# Patient Record
Sex: Male | Born: 1945
Health system: Southern US, Community
[De-identification: ages and names within clinical notes are randomized; demographics above are authoritative.]

## PROBLEM LIST (undated history)

## (undated) ENCOUNTER — Emergency Department (HOSPITAL_COMMUNITY): Payer: Medicare HMO

## (undated) DIAGNOSIS — I2510 Atherosclerotic heart disease of native coronary artery without angina pectoris: Secondary | ICD-10-CM

## (undated) DIAGNOSIS — I10 Essential (primary) hypertension: Secondary | ICD-10-CM

## (undated) DIAGNOSIS — E785 Hyperlipidemia, unspecified: Secondary | ICD-10-CM

## (undated) DIAGNOSIS — R220 Localized swelling, mass and lump, head: Secondary | ICD-10-CM

## (undated) DIAGNOSIS — N5201 Erectile dysfunction due to arterial insufficiency: Secondary | ICD-10-CM

## (undated) HISTORY — DX: Essential (primary) hypertension: I10

## (undated) HISTORY — DX: Atherosclerotic heart disease of native coronary artery without angina pectoris: I25.10

## (undated) HISTORY — DX: Erectile dysfunction due to arterial insufficiency: N52.01

## (undated) HISTORY — DX: Localized swelling, mass and lump, head: R22.0

## (undated) HISTORY — DX: Hyperlipidemia, unspecified: E78.5

## (undated) HISTORY — PX: SHOULDER SURGERY: SHX246

---

## 2006-09-28 ENCOUNTER — Encounter: Admission: RE | Admit: 2006-09-28 | Discharge: 2006-09-28 | Payer: Self-pay | Admitting: Orthopedic Surgery

## 2008-04-05 IMAGING — CR DG CHEST 2V
2 series · 2 of 2 positions shown · non-contrast
Comparison: None.

CLINICAL DATA: Shoulder pain.
 CHEST - TWO VIEWS:

[view not recorded (1 of 2)]
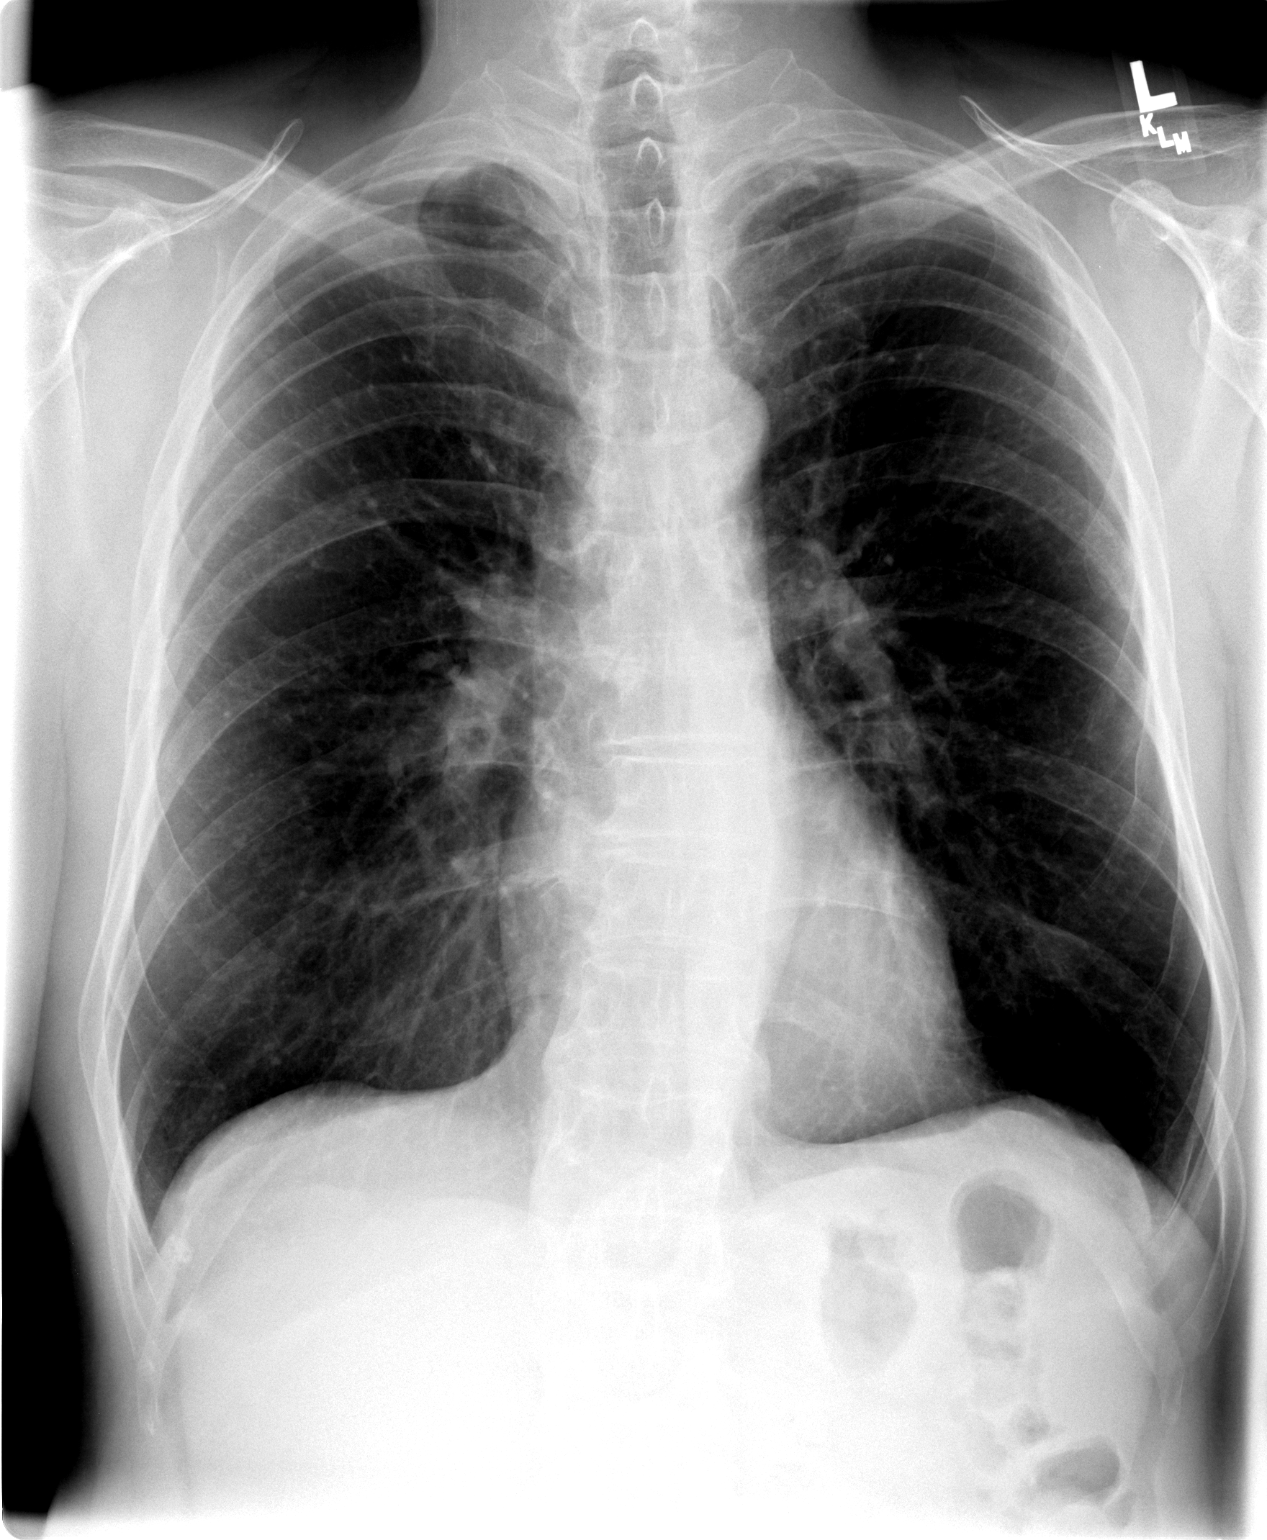

[view not recorded (2 of 2)]
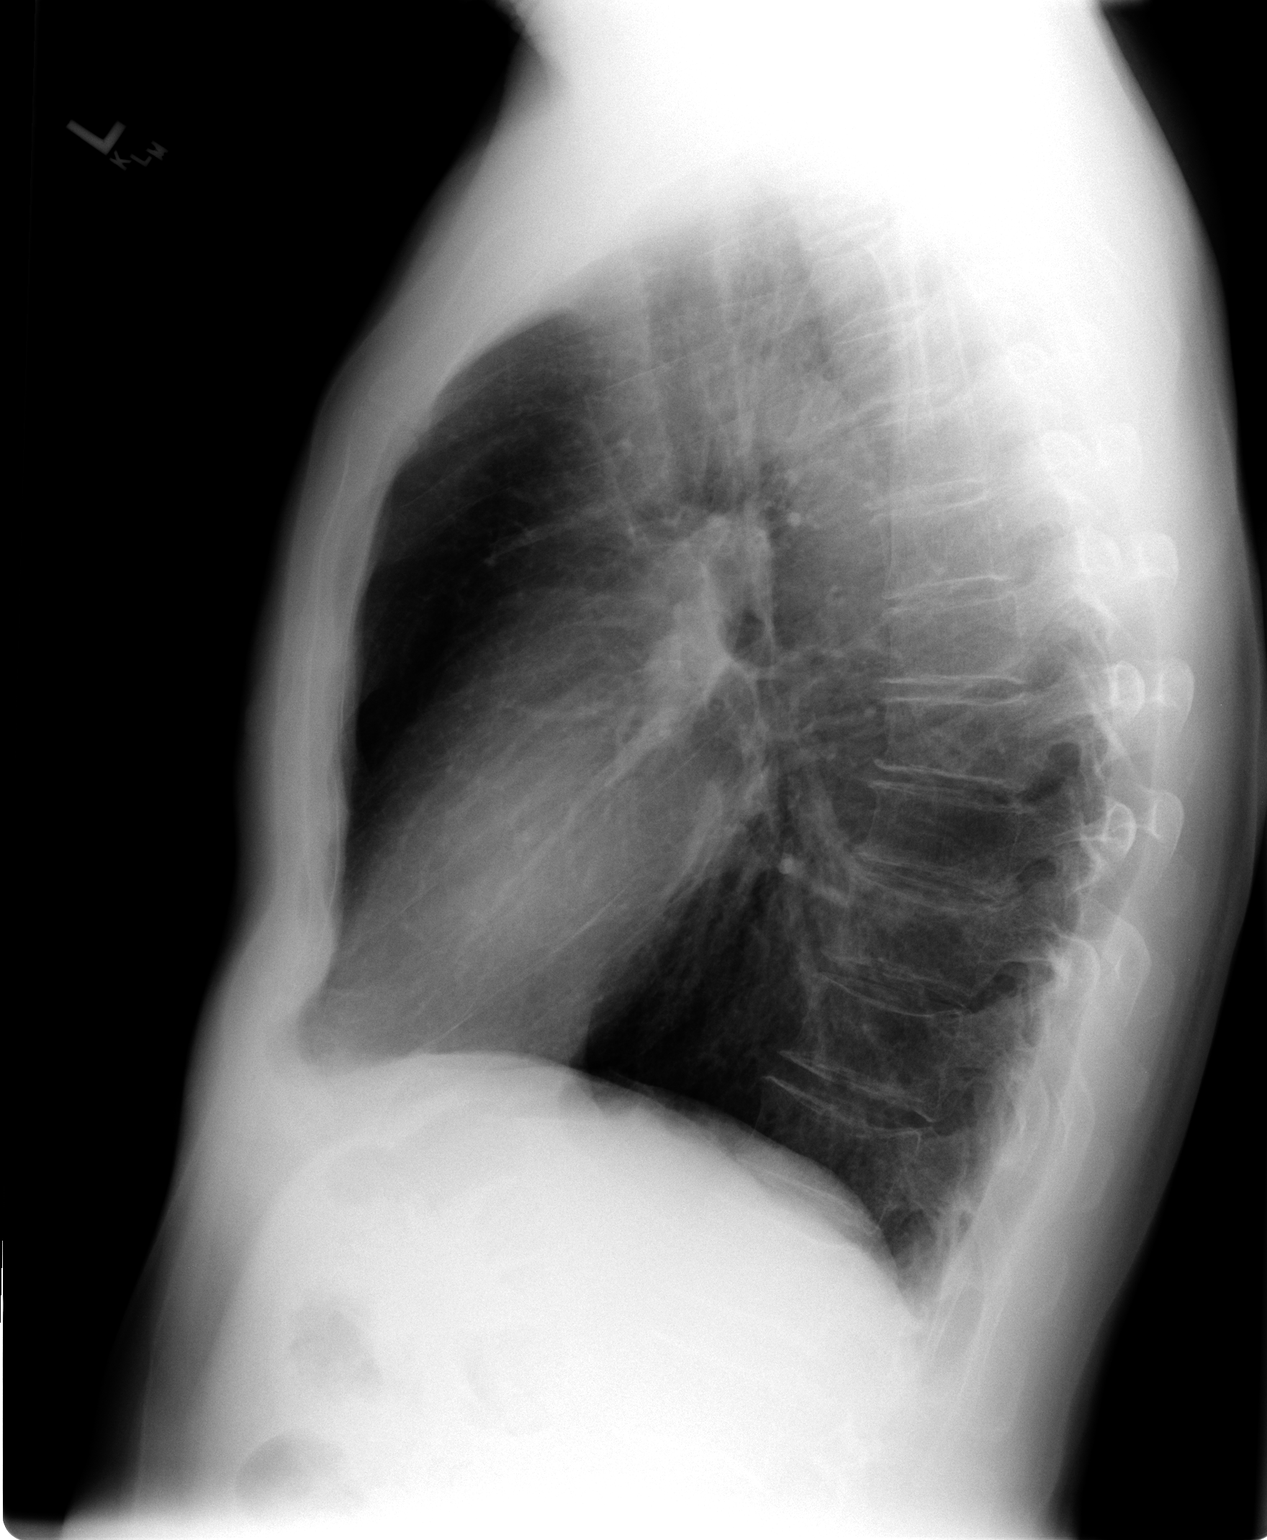

[2 of 2 positions shown; findings below may reference images not displayed]

There is mild hyperinflation.  The lungs are clear and the heart size is normal.  Mild apical scarring is noted bilaterally.
IMPRESSION: COPD.  No acute abnormality.

## 2015-05-22 DIAGNOSIS — E785 Hyperlipidemia, unspecified: Secondary | ICD-10-CM

## 2015-05-22 DIAGNOSIS — N5201 Erectile dysfunction due to arterial insufficiency: Secondary | ICD-10-CM

## 2015-05-22 DIAGNOSIS — I2510 Atherosclerotic heart disease of native coronary artery without angina pectoris: Secondary | ICD-10-CM

## 2015-05-22 DIAGNOSIS — I10 Essential (primary) hypertension: Secondary | ICD-10-CM | POA: Insufficient documentation

## 2015-05-22 HISTORY — DX: Atherosclerotic heart disease of native coronary artery without angina pectoris: I25.10

## 2015-05-22 HISTORY — DX: Essential (primary) hypertension: I10

## 2015-05-22 HISTORY — DX: Hyperlipidemia, unspecified: E78.5

## 2015-05-22 HISTORY — DX: Erectile dysfunction due to arterial insufficiency: N52.01

## 2015-07-04 ENCOUNTER — Encounter (INDEPENDENT_AMBULATORY_CARE_PROVIDER_SITE_OTHER): Payer: Medicare Other | Admitting: Ophthalmology

## 2015-07-04 DIAGNOSIS — H353112 Nonexudative age-related macular degeneration, right eye, intermediate dry stage: Secondary | ICD-10-CM | POA: Diagnosis not present

## 2015-07-04 DIAGNOSIS — H35033 Hypertensive retinopathy, bilateral: Secondary | ICD-10-CM | POA: Diagnosis not present

## 2015-07-04 DIAGNOSIS — H353124 Nonexudative age-related macular degeneration, left eye, advanced atrophic with subfoveal involvement: Secondary | ICD-10-CM

## 2015-07-04 DIAGNOSIS — I10 Essential (primary) hypertension: Secondary | ICD-10-CM

## 2015-07-04 DIAGNOSIS — H43813 Vitreous degeneration, bilateral: Secondary | ICD-10-CM | POA: Diagnosis not present

## 2015-08-06 DIAGNOSIS — H25012 Cortical age-related cataract, left eye: Secondary | ICD-10-CM | POA: Diagnosis not present

## 2015-08-06 DIAGNOSIS — H353124 Nonexudative age-related macular degeneration, left eye, advanced atrophic with subfoveal involvement: Secondary | ICD-10-CM | POA: Diagnosis not present

## 2015-08-06 DIAGNOSIS — H25011 Cortical age-related cataract, right eye: Secondary | ICD-10-CM | POA: Diagnosis not present

## 2015-08-06 DIAGNOSIS — H35031 Hypertensive retinopathy, right eye: Secondary | ICD-10-CM | POA: Diagnosis not present

## 2015-08-06 DIAGNOSIS — H35032 Hypertensive retinopathy, left eye: Secondary | ICD-10-CM | POA: Diagnosis not present

## 2015-08-11 DIAGNOSIS — Z13220 Encounter for screening for lipoid disorders: Secondary | ICD-10-CM | POA: Diagnosis not present

## 2015-08-11 DIAGNOSIS — Z125 Encounter for screening for malignant neoplasm of prostate: Secondary | ICD-10-CM | POA: Diagnosis not present

## 2015-08-11 DIAGNOSIS — M62522 Muscle wasting and atrophy, not elsewhere classified, left upper arm: Secondary | ICD-10-CM | POA: Diagnosis not present

## 2015-08-11 DIAGNOSIS — Z1389 Encounter for screening for other disorder: Secondary | ICD-10-CM | POA: Diagnosis not present

## 2015-08-11 DIAGNOSIS — Z139 Encounter for screening, unspecified: Secondary | ICD-10-CM | POA: Diagnosis not present

## 2015-08-11 DIAGNOSIS — Z79899 Other long term (current) drug therapy: Secondary | ICD-10-CM | POA: Diagnosis not present

## 2015-08-11 DIAGNOSIS — I10 Essential (primary) hypertension: Secondary | ICD-10-CM | POA: Diagnosis not present

## 2015-08-19 DIAGNOSIS — H2511 Age-related nuclear cataract, right eye: Secondary | ICD-10-CM | POA: Diagnosis not present

## 2015-08-19 DIAGNOSIS — H2512 Age-related nuclear cataract, left eye: Secondary | ICD-10-CM | POA: Diagnosis not present

## 2015-09-08 DIAGNOSIS — H25011 Cortical age-related cataract, right eye: Secondary | ICD-10-CM | POA: Diagnosis not present

## 2015-09-08 DIAGNOSIS — H2511 Age-related nuclear cataract, right eye: Secondary | ICD-10-CM | POA: Diagnosis not present

## 2015-09-16 DIAGNOSIS — H2511 Age-related nuclear cataract, right eye: Secondary | ICD-10-CM | POA: Diagnosis not present

## 2015-09-19 DIAGNOSIS — M4722 Other spondylosis with radiculopathy, cervical region: Secondary | ICD-10-CM | POA: Diagnosis not present

## 2015-09-19 DIAGNOSIS — M25512 Pain in left shoulder: Secondary | ICD-10-CM | POA: Diagnosis not present

## 2015-09-19 DIAGNOSIS — M4802 Spinal stenosis, cervical region: Secondary | ICD-10-CM | POA: Diagnosis not present

## 2015-09-19 DIAGNOSIS — M5412 Radiculopathy, cervical region: Secondary | ICD-10-CM | POA: Diagnosis not present

## 2016-01-02 ENCOUNTER — Ambulatory Visit (INDEPENDENT_AMBULATORY_CARE_PROVIDER_SITE_OTHER): Payer: Medicare Other | Admitting: Ophthalmology

## 2016-03-11 DIAGNOSIS — E785 Hyperlipidemia, unspecified: Secondary | ICD-10-CM | POA: Diagnosis not present

## 2016-03-11 DIAGNOSIS — I2510 Atherosclerotic heart disease of native coronary artery without angina pectoris: Secondary | ICD-10-CM | POA: Diagnosis not present

## 2016-03-11 DIAGNOSIS — I10 Essential (primary) hypertension: Secondary | ICD-10-CM | POA: Diagnosis not present

## 2016-03-29 DIAGNOSIS — E785 Hyperlipidemia, unspecified: Secondary | ICD-10-CM | POA: Diagnosis not present

## 2016-03-29 DIAGNOSIS — I2510 Atherosclerotic heart disease of native coronary artery without angina pectoris: Secondary | ICD-10-CM | POA: Diagnosis not present

## 2016-03-29 DIAGNOSIS — I10 Essential (primary) hypertension: Secondary | ICD-10-CM | POA: Diagnosis not present

## 2016-10-11 DIAGNOSIS — I10 Essential (primary) hypertension: Secondary | ICD-10-CM | POA: Diagnosis not present

## 2016-10-11 DIAGNOSIS — E785 Hyperlipidemia, unspecified: Secondary | ICD-10-CM | POA: Diagnosis not present

## 2016-10-11 DIAGNOSIS — I2510 Atherosclerotic heart disease of native coronary artery without angina pectoris: Secondary | ICD-10-CM | POA: Diagnosis not present

## 2016-10-19 DIAGNOSIS — Z139 Encounter for screening, unspecified: Secondary | ICD-10-CM | POA: Diagnosis not present

## 2016-10-19 DIAGNOSIS — I10 Essential (primary) hypertension: Secondary | ICD-10-CM | POA: Diagnosis not present

## 2016-10-19 DIAGNOSIS — Z1389 Encounter for screening for other disorder: Secondary | ICD-10-CM | POA: Diagnosis not present

## 2016-10-19 DIAGNOSIS — Z0000 Encounter for general adult medical examination without abnormal findings: Secondary | ICD-10-CM | POA: Diagnosis not present

## 2016-11-02 DIAGNOSIS — I10 Essential (primary) hypertension: Secondary | ICD-10-CM | POA: Diagnosis not present

## 2016-11-02 DIAGNOSIS — E785 Hyperlipidemia, unspecified: Secondary | ICD-10-CM | POA: Diagnosis not present

## 2017-04-07 DIAGNOSIS — H353111 Nonexudative age-related macular degeneration, right eye, early dry stage: Secondary | ICD-10-CM | POA: Diagnosis not present

## 2017-08-18 DIAGNOSIS — D234 Other benign neoplasm of skin of scalp and neck: Secondary | ICD-10-CM | POA: Diagnosis not present

## 2017-08-30 DIAGNOSIS — R220 Localized swelling, mass and lump, head: Secondary | ICD-10-CM | POA: Insufficient documentation

## 2017-08-30 HISTORY — DX: Localized swelling, mass and lump, head: R22.0

## 2017-08-31 DIAGNOSIS — J189 Pneumonia, unspecified organism: Secondary | ICD-10-CM | POA: Diagnosis not present

## 2017-09-09 ENCOUNTER — Other Ambulatory Visit: Payer: Self-pay | Admitting: *Deleted

## 2017-09-09 ENCOUNTER — Encounter: Payer: Self-pay | Admitting: *Deleted

## 2017-09-15 ENCOUNTER — Encounter: Payer: Self-pay | Admitting: Cardiology

## 2017-09-15 ENCOUNTER — Ambulatory Visit (INDEPENDENT_AMBULATORY_CARE_PROVIDER_SITE_OTHER): Payer: Medicare HMO | Admitting: Cardiology

## 2017-09-15 VITALS — BP 120/60 | HR 86 | Ht 71.0 in | Wt 165.8 lb

## 2017-09-15 DIAGNOSIS — I2510 Atherosclerotic heart disease of native coronary artery without angina pectoris: Secondary | ICD-10-CM

## 2017-09-15 DIAGNOSIS — I10 Essential (primary) hypertension: Secondary | ICD-10-CM | POA: Diagnosis not present

## 2017-09-15 DIAGNOSIS — R220 Localized swelling, mass and lump, head: Secondary | ICD-10-CM

## 2017-09-15 DIAGNOSIS — E785 Hyperlipidemia, unspecified: Secondary | ICD-10-CM

## 2017-09-15 NOTE — Progress Notes (Signed)
Cardiology Office Note:    Date:  09/15/2017   ID:  Ian Gardner, DOB [birth year], MRN [id number]  PCP:  Dortha Kern, PA  Cardiologist:  Jenne Campus, MD    Referring MD: No ref. provider found   Chief Complaint  Patient presents with  . Pre-op Exam    Needs to be cleared to hold Anti-coag  Need scalp surgery  History of Present Illness:    Ian Gardner is a 72 y.o. male with coronary artery disease status post myocardial infarction many years ago.  Diminished left ventricular ejection fraction.  He comes to my office because he required scalp surgery.  He does have a mass there looks like lipoma that been there for last 10 years however recently started growing.  I am not sure exactly what kind of anesthesia will be used during the procedure.  It is not sure either.  On the 11th of this month he does have appointment with anesthesiologist that make me to think that most likely general anesthesia will be used.  All he seems to be doing well he did have pneumonia that he is recovering from denies have any chest pain tightness squeezing pressure burning chest does have shortness of breath with exertion.  He tells me that he can climb 2 flights of stairs with some shortness of breath but no need to stop.  Past Medical History:  Diagnosis Date  . Coronary artery disease involving native coronary artery of native heart without angina pectoris 05/22/2015   Overview:  Old myocardial infarction Overview:  Overview:  Old myocardial infarction  . Dyslipidemia 05/22/2015  . Erectile dysfunction due to arterial insufficiency 05/22/2015  . Essential hypertension 05/22/2015  . Scalp mass 08/30/2017      Current Medications: Current Meds  Medication Sig  . amLODipine-valsartan (EXFORGE) 10-160 MG tablet Take by mouth daily.  Marland Kitchen atorvastatin (LIPITOR) 40 MG tablet Take by mouth daily.  . clopidogrel (PLAVIX) 75 MG tablet Take by mouth daily.  . metoprolol tartrate (LOPRESSOR) 50 MG  tablet Take 25 mg by mouth daily.  . Multiple Vitamins-Minerals (ICAPS AREDS FORMULA PO) Take by mouth daily.     Allergies:   Patient has no known allergies.   Social History   Socioeconomic History  . Marital status: Married    Spouse name: Not on file  . Number of children: Not on file  . Years of education: Not on file  . Highest education level: Not on file  Social Needs  . Financial resource strain: Not on file  . Food insecurity - worry: Not on file  . Food insecurity - inability: Not on file  . Transportation needs - medical: Not on file  . Transportation needs - non-medical: Not on file  Occupational History  . Not on file  Tobacco Use  . Smoking status: Current Some Day Smoker  . Smokeless tobacco: Never Used  Substance and Sexual Activity  . Alcohol use: Yes  . Drug use: No  . Sexual activity: Not on file  Other Topics Concern  . Not on file  Social History Narrative  . Not on file     Family History: The patient's family history includes Cancer in his maternal grandmother; Heart disease in his father; Stroke in his father. ROS:   Please see the history of present illness.    All 14 point review of systems negative except as described per history of present illness  EKGs/Labs/Other Studies Reviewed:    EKG done today  showed normal sinus rhythm, there is a Q wave in V1 to V4 indicating all anterior septal wall microinfarction.  No acute changes.  Recent Labs: No results found for requested labs within last 8760 hours.  Recent Lipid Panel No results found for: CHOL, TRIG, HDL, CHOLHDL, VLDL, LDLCALC, LDLDIRECT  Physical Exam:    VS:  BP 120/60   Pulse 86   Ht 5\' 11"  (1.803 m)   Wt 165 lb 12.8 oz (75.2 kg)   SpO2 97%   BMI 23.12 kg/m     Wt Readings from Last 3 Encounters:  09/15/17 165 lb 12.8 oz (75.2 kg)     GEN:  Well nourished, well developed in no acute distress HEENT: Normal NECK: No JVD; No carotid bruits LYMPHATICS: No  lymphadenopathy CARDIAC: RRR, no murmurs, no rubs, no gallops RESPIRATORY:  Clear to auscultation without rales, wheezing or rhonchi  ABDOMEN: Soft, non-tender, non-distended MUSCULOSKELETAL:  No edema; No deformity  SKIN: Warm and dry LOWER EXTREMITIES: no swelling NEUROLOGIC:  Alert and oriented x 3 PSYCHIATRIC:  Normal affect   ASSESSMENT:    1. Coronary artery disease involving native coronary artery of native heart without angina pectoris   2. Essential hypertension   3. Dyslipidemia   4. Scalp mass    PLAN:    In order of problems listed above:  1. Coronary artery disease: Appears to be asymptomatic and appropriate medications which I will continue.  I asked her to have echocardiogram done to assess his left ventricular ejection fraction.  He is already on beta-blocker as well as ARB. 2. Essential hypertension: Blood pressure well controlled we will continue present management. 3. Lipidemia he is on high intensity statin which I will continue he need to have fasting lipid profile done but he ate bags of chips before he came to my office therefore he will not have his fasting lipid profile done today which will be scheduled different time. 4. Scalpel mask for surgery.  We will try to figure out what kind of anesthesia will use we will get echocardiogram.  But overall he seems to be doing quite well from cardiac standpoint review 5. Smoking: We had some at least 5 minutes discussion about this and I strongly recommended to quit.   Medication Adjustments/Labs and Tests Ordered: Current medicines are reviewed at length with the patient today.  Concerns regarding medicines are outlined above.  No orders of the defined types were placed in this encounter.  Medication changes: No orders of the defined types were placed in this encounter.   Signed, Park Liter, MD, [facility] 09/15/2017 9:40 AM    [place]

## 2017-09-15 NOTE — Patient Instructions (Addendum)
Medication Instructions:  Your physician recommends that you continue on your current medications as directed. Please refer to the Current Medication list given to you today.   Labwork: Your physician recommends that you return for lab work: Have lipid panel done with pre-surgical labs.  Testing/Procedures: Your physician has requested that you have an echocardiogram. Echocardiography is a painless test that uses sound waves to create images of your heart. It provides your doctor with information about the size and shape of your heart and how well your heart's chambers and valves are working. This procedure takes approximately one hour. There are no restrictions for this procedure.    Follow-Up: Your physician wants you to follow-up in: 6 months. You will receive a reminder letter in the mail two months in advance. If you don't receive a letter, please call our office to schedule the follow-up appointment.   Any Other Special Instructions Will Be Listed Below (If Applicable).     If you need a refill on your cardiac medications before your next appointment, please call your pharmacy.

## 2017-09-21 ENCOUNTER — Telehealth: Payer: Self-pay | Admitting: *Deleted

## 2017-09-21 ENCOUNTER — Ambulatory Visit (HOSPITAL_COMMUNITY): Payer: Medicare HMO | Attending: Cardiology

## 2017-09-21 ENCOUNTER — Other Ambulatory Visit: Payer: Self-pay

## 2017-09-21 DIAGNOSIS — I252 Old myocardial infarction: Secondary | ICD-10-CM | POA: Insufficient documentation

## 2017-09-21 DIAGNOSIS — I10 Essential (primary) hypertension: Secondary | ICD-10-CM | POA: Diagnosis not present

## 2017-09-21 DIAGNOSIS — E785 Hyperlipidemia, unspecified: Secondary | ICD-10-CM | POA: Diagnosis not present

## 2017-09-21 DIAGNOSIS — I2510 Atherosclerotic heart disease of native coronary artery without angina pectoris: Secondary | ICD-10-CM | POA: Diagnosis not present

## 2017-09-21 NOTE — Telephone Encounter (Signed)
Patient had question about when to stop plavix before surgery scheduled for 10/03/2017. Confirmed general anesthesia will be used for procedure by Shirlean Mylar at [facility] today. Dr. Geraldo Pitter advised to stop plavix 5 days before surgery (09/28/2017). Patient informed of this. Patient verbalized understanding. No further questions.

## 2017-10-05 DIAGNOSIS — R220 Localized swelling, mass and lump, head: Secondary | ICD-10-CM | POA: Diagnosis not present

## 2017-10-05 DIAGNOSIS — Z7901 Long term (current) use of anticoagulants: Secondary | ICD-10-CM | POA: Diagnosis not present

## 2017-10-05 DIAGNOSIS — I252 Old myocardial infarction: Secondary | ICD-10-CM | POA: Diagnosis not present

## 2017-10-05 DIAGNOSIS — Z85828 Personal history of other malignant neoplasm of skin: Secondary | ICD-10-CM | POA: Diagnosis not present

## 2017-10-05 DIAGNOSIS — I2510 Atherosclerotic heart disease of native coronary artery without angina pectoris: Secondary | ICD-10-CM | POA: Diagnosis not present

## 2017-10-05 DIAGNOSIS — D234 Other benign neoplasm of skin of scalp and neck: Secondary | ICD-10-CM | POA: Diagnosis not present

## 2017-10-05 DIAGNOSIS — E785 Hyperlipidemia, unspecified: Secondary | ICD-10-CM | POA: Diagnosis not present

## 2017-10-05 DIAGNOSIS — I10 Essential (primary) hypertension: Secondary | ICD-10-CM | POA: Diagnosis not present

## 2017-10-05 DIAGNOSIS — R69 Illness, unspecified: Secondary | ICD-10-CM | POA: Diagnosis not present

## 2017-10-12 DIAGNOSIS — J189 Pneumonia, unspecified organism: Secondary | ICD-10-CM | POA: Diagnosis not present

## 2017-10-12 DIAGNOSIS — R05 Cough: Secondary | ICD-10-CM | POA: Diagnosis not present

## 2017-10-18 DIAGNOSIS — E785 Hyperlipidemia, unspecified: Secondary | ICD-10-CM | POA: Diagnosis not present

## 2017-10-18 DIAGNOSIS — Z0000 Encounter for general adult medical examination without abnormal findings: Secondary | ICD-10-CM | POA: Diagnosis not present

## 2017-10-18 DIAGNOSIS — I119 Hypertensive heart disease without heart failure: Secondary | ICD-10-CM | POA: Diagnosis not present

## 2017-10-18 DIAGNOSIS — Z9181 History of falling: Secondary | ICD-10-CM | POA: Diagnosis not present

## 2017-10-18 DIAGNOSIS — Z720 Tobacco use: Secondary | ICD-10-CM | POA: Diagnosis not present

## 2017-10-18 DIAGNOSIS — Z125 Encounter for screening for malignant neoplasm of prostate: Secondary | ICD-10-CM | POA: Diagnosis not present

## 2018-01-25 ENCOUNTER — Other Ambulatory Visit: Payer: Self-pay

## 2018-01-25 MED ORDER — ATORVASTATIN CALCIUM 40 MG PO TABS: 40.0000 mg | ORAL_TABLET | Freq: Every day | ORAL | 0 refills | Status: DC

## 2018-01-25 MED ORDER — CLOPIDOGREL BISULFATE 75 MG PO TABS: 75.0000 mg | ORAL_TABLET | Freq: Every day | ORAL | 0 refills | Status: DC

## 2018-02-21 DIAGNOSIS — H538 Other visual disturbances: Secondary | ICD-10-CM | POA: Diagnosis not present

## 2018-02-21 DIAGNOSIS — H353124 Nonexudative age-related macular degeneration, left eye, advanced atrophic with subfoveal involvement: Secondary | ICD-10-CM | POA: Diagnosis not present

## 2018-02-27 DIAGNOSIS — Z961 Presence of intraocular lens: Secondary | ICD-10-CM | POA: Diagnosis not present

## 2018-02-27 DIAGNOSIS — H353124 Nonexudative age-related macular degeneration, left eye, advanced atrophic with subfoveal involvement: Secondary | ICD-10-CM | POA: Diagnosis not present

## 2018-02-27 DIAGNOSIS — H353190 Nonexudative age-related macular degeneration, unspecified eye, stage unspecified: Secondary | ICD-10-CM | POA: Diagnosis not present

## 2018-02-27 DIAGNOSIS — H353112 Nonexudative age-related macular degeneration, right eye, intermediate dry stage: Secondary | ICD-10-CM | POA: Diagnosis not present

## 2018-02-27 DIAGNOSIS — H35363 Drusen (degenerative) of macula, bilateral: Secondary | ICD-10-CM | POA: Diagnosis not present

## 2018-04-20 DIAGNOSIS — E785 Hyperlipidemia, unspecified: Secondary | ICD-10-CM | POA: Diagnosis not present

## 2018-04-20 DIAGNOSIS — I119 Hypertensive heart disease without heart failure: Secondary | ICD-10-CM | POA: Diagnosis not present

## 2018-04-27 DIAGNOSIS — E785 Hyperlipidemia, unspecified: Secondary | ICD-10-CM | POA: Diagnosis not present

## 2018-04-27 DIAGNOSIS — Z720 Tobacco use: Secondary | ICD-10-CM | POA: Diagnosis not present

## 2018-04-27 DIAGNOSIS — N529 Male erectile dysfunction, unspecified: Secondary | ICD-10-CM | POA: Diagnosis not present

## 2018-04-27 DIAGNOSIS — I119 Hypertensive heart disease without heart failure: Secondary | ICD-10-CM | POA: Diagnosis not present

## 2018-04-27 DIAGNOSIS — Z139 Encounter for screening, unspecified: Secondary | ICD-10-CM | POA: Diagnosis not present

## 2018-04-27 DIAGNOSIS — Z1331 Encounter for screening for depression: Secondary | ICD-10-CM | POA: Diagnosis not present

## 2018-06-22 ENCOUNTER — Other Ambulatory Visit: Payer: Self-pay

## 2018-06-22 MED ORDER — CLOPIDOGREL BISULFATE 75 MG PO TABS: 75.0000 mg | ORAL_TABLET | Freq: Every day | ORAL | 1 refills | Status: DC

## 2018-06-22 MED ORDER — ATORVASTATIN CALCIUM 40 MG PO TABS: 40.0000 mg | ORAL_TABLET | Freq: Every day | ORAL | 1 refills | Status: DC

## 2018-10-18 DIAGNOSIS — I119 Hypertensive heart disease without heart failure: Secondary | ICD-10-CM | POA: Diagnosis not present

## 2018-10-18 DIAGNOSIS — E785 Hyperlipidemia, unspecified: Secondary | ICD-10-CM | POA: Diagnosis not present

## 2018-10-27 DIAGNOSIS — E785 Hyperlipidemia, unspecified: Secondary | ICD-10-CM | POA: Diagnosis not present

## 2018-10-27 DIAGNOSIS — I119 Hypertensive heart disease without heart failure: Secondary | ICD-10-CM | POA: Diagnosis not present

## 2018-10-27 DIAGNOSIS — I2510 Atherosclerotic heart disease of native coronary artery without angina pectoris: Secondary | ICD-10-CM | POA: Diagnosis not present

## 2019-03-02 ENCOUNTER — Other Ambulatory Visit: Payer: Self-pay | Admitting: Cardiology

## 2019-03-14 ENCOUNTER — Other Ambulatory Visit: Payer: Self-pay | Admitting: Cardiology

## 2019-04-04 DIAGNOSIS — E785 Hyperlipidemia, unspecified: Secondary | ICD-10-CM | POA: Diagnosis not present

## 2019-04-04 DIAGNOSIS — Z125 Encounter for screening for malignant neoplasm of prostate: Secondary | ICD-10-CM | POA: Diagnosis not present

## 2019-04-04 DIAGNOSIS — I2510 Atherosclerotic heart disease of native coronary artery without angina pectoris: Secondary | ICD-10-CM | POA: Diagnosis not present

## 2019-04-04 DIAGNOSIS — I119 Hypertensive heart disease without heart failure: Secondary | ICD-10-CM | POA: Diagnosis not present

## 2019-05-01 ENCOUNTER — Encounter: Payer: Self-pay | Admitting: Cardiology

## 2019-05-01 ENCOUNTER — Ambulatory Visit (INDEPENDENT_AMBULATORY_CARE_PROVIDER_SITE_OTHER): Payer: Medicare HMO | Admitting: Cardiology

## 2019-05-01 ENCOUNTER — Other Ambulatory Visit: Payer: Self-pay

## 2019-05-01 VITALS — BP 162/88 | HR 62 | Ht 71.0 in | Wt 161.4 lb

## 2019-05-01 DIAGNOSIS — E785 Hyperlipidemia, unspecified: Secondary | ICD-10-CM | POA: Diagnosis not present

## 2019-05-01 DIAGNOSIS — I2510 Atherosclerotic heart disease of native coronary artery without angina pectoris: Secondary | ICD-10-CM

## 2019-05-01 DIAGNOSIS — I10 Essential (primary) hypertension: Secondary | ICD-10-CM

## 2019-05-01 MED ORDER — NITROGLYCERIN 0.4 MG SL SUBL: 0.4000 mg | SUBLINGUAL_TABLET | SUBLINGUAL | 3 refills | Status: DC | PRN

## 2019-05-01 MED ORDER — VALSARTAN 160 MG PO TABS: 160.0000 mg | ORAL_TABLET | Freq: Every day | ORAL | 4 refills | Status: DC

## 2019-05-01 NOTE — Progress Notes (Signed)
Cardiology Office Note:    Date:  05/01/2019   ID:  Ian Gardner, DOB 1946/03/04, MRN [id number]  PCP:  Dortha Kern, PA  Cardiologist:  Jenean Lindau, MD   Referring MD: Dortha Kern, [place]    ASSESSMENT:    1. Coronary artery disease involving native coronary artery of native heart without angina pectoris   2. Essential hypertension   3. Dyslipidemia    PLAN:    In order of problems listed above:  1. Coronary artery disease: Secondary prevention stressed to the patient.  Importance of compliance with diet and medication stressed and he vocalized understanding. 2. Essential hypertension: Blood pressure is elevated.  I have added valsartan to 160 mg daily to his regimen.  He will be back in 2 weeks for a Chem-7 and will keep a track of his blood pressures. 3. He will have blood work at that time also LFTs.  We will refill his other medications today. 4. Cigarette smoker: I spent 5 minutes with the patient discussing solely about smoking. Smoking cessation was counseled. I suggested to the patient also different medications and pharmacological interventions. Patient is keen to try stopping on its own at this time. He will get back to me if he needs any further assistance in this matter. 5. Patient will be seen in follow-up appointment in 6 months or earlier if the patient has any concerns    Medication Adjustments/Labs and Tests Ordered: Current medicines are reviewed at length with the patient today.  Concerns regarding medicines are outlined above.  No orders of the defined types were placed in this encounter.  No orders of the defined types were placed in this encounter.    No chief complaint on file.    History of Present Illness:    Ian Gardner is a 73 y.o. male.  Patient has past medical history of coronary artery disease.  Unfortunately continues to smoke.  He works full-time.  He denies any chest pain orthopnea or PND.  He stopped taking valsartan  medication.  He is here for that evaluation and follow-up.  At the time of my evaluation, the patient is alert awake oriented and in no distress.  Past Medical History:  Diagnosis Date  . Coronary artery disease involving native coronary artery of native heart without angina pectoris 05/22/2015   Overview:  Old myocardial infarction Overview:  Overview:  Old myocardial infarction  . Dyslipidemia 05/22/2015  . Erectile dysfunction due to arterial insufficiency 05/22/2015  . Essential hypertension 05/22/2015  . Scalp mass 08/30/2017     Current Medications: Current Meds  Medication Sig  . atorvastatin (LIPITOR) 40 MG tablet Take 1 tablet (40 mg total) by mouth daily. NEED OFFICE VISIT FOR MORE REFILLS  . clopidogrel (PLAVIX) 75 MG tablet Take 1 tablet (75 mg total) by mouth daily. NEEDS OFFICE VISIT FOR MORE REFILLS  . metoprolol tartrate (LOPRESSOR) 50 MG tablet Take 25 mg by mouth daily.  . Multiple Vitamins-Minerals (ICAPS AREDS FORMULA PO) Take by mouth daily.     Allergies:   Patient has no known allergies.   Social History   Socioeconomic History  . Marital status: Married    Spouse name: Not on file  . Number of children: Not on file  . Years of education: Not on file  . Highest education level: Not on file  Occupational History  . Not on file  Social Needs  . Financial resource strain: Not on file  . Food insecurity  Worry: Not on file    Inability: Not on file  . Transportation needs    Medical: Not on file    Non-medical: Not on file  Tobacco Use  . Smoking status: Current Some Day Smoker  . Smokeless tobacco: Never Used  Substance and Sexual Activity  . Alcohol use: Yes  . Drug use: No  . Sexual activity: Not on file  Lifestyle  . Physical activity    Days per week: Not on file    Minutes per session: Not on file  . Stress: Not on file  Relationships  . Social Herbalist on phone: Not on file    Gets together: Not on file    Attends religious  service: Not on file    Active member of club or organization: Not on file    Attends meetings of clubs or organizations: Not on file    Relationship status: Not on file  Other Topics Concern  . Not on file  Social History Narrative  . Not on file     Family History: The patient's family history includes Cancer in his maternal grandmother; Heart disease in his father; Stroke in his father.  ROS:   Please see the history of present illness.    All other systems reviewed and are negative.  EKGs/Labs/Other Studies Reviewed:    The following studies were reviewed today: I discussed previous studies with the patient at length including recent lab work   Recent Labs: No results found for requested labs within last 8760 hours.  Recent Lipid Panel No results found for: CHOL, TRIG, HDL, CHOLHDL, VLDL, LDLCALC, LDLDIRECT  Physical Exam:    VS:  BP (!) 162/88 (BP Location: Left Arm, Patient Position: Sitting, Cuff Size: Normal)   Pulse 62   Ht 5\' 11"  (1.803 m)   Wt 161 lb 6.4 oz (73.2 kg)   SpO2 95%   BMI 22.51 kg/m     Wt Readings from Last 3 Encounters:  05/01/19 161 lb 6.4 oz (73.2 kg)  09/15/17 165 lb 12.8 oz (75.2 kg)     GEN: Patient is in no acute distress HEENT: Normal NECK: No JVD; No carotid bruits LYMPHATICS: No lymphadenopathy CARDIAC: Hear sounds regular, 2/6 systolic murmur at the apex. RESPIRATORY:  Clear to auscultation without rales, wheezing or rhonchi  ABDOMEN: Soft, non-tender, non-distended MUSCULOSKELETAL:  No edema; No deformity  SKIN: Warm and dry NEUROLOGIC:  Alert and oriented x 3 PSYCHIATRIC:  Normal affect   Signed, Jenean Lindau, MD  05/01/2019 2:24 PM    [place]

## 2019-05-01 NOTE — Addendum Note (Signed)
Addended by: Beckey Rutter on: 05/01/2019 02:43 PM   Modules accepted: Orders

## 2019-05-01 NOTE — Patient Instructions (Signed)
Medication Instructions:  Your physician has recommended you make the following change in your medication:   START taking valsartan 160 mg (1 tablet) once daily  START taking nitroglycerin as needed for chest pain. When having chest pain, stop what you are doing and sit down. Take 1 nitro, wait 5 minutes. Still having chest pain, take 1 nitro, wait 5 minutes. Still having chest pain, take 1 nitro, dial 911. Total of 3 nitro in 15 minutes.  If you need a refill on your cardiac medications before your next appointment, please call your pharmacy.   Lab work: Your physician recommends that you return in 2 weeks for a BMP and hepatic to be drawn.  If you have labs (blood work) drawn today and your tests are completely normal, you will receive your results only by: Marland Kitchen MyChart Message (if you have MyChart) OR . A paper copy in the mail If you have any lab test that is abnormal or we need to change your treatment, we will call you to review the results.  Testing/Procedures: NONE  Follow-Up: At [facility], you and your health needs are our priority.  As part of our continuing mission to provide you with exceptional heart care, we have created designated Provider Care Teams.  These Care Teams include your primary Cardiologist (physician) and Advanced Practice Providers (APPs -  Physician Assistants and Nurse Practitioners) who all work together to provide you with the care you need, when you need it. You will need a follow up appointment in 3 months.    Any Other Special Instructions Will Be Listed Below  Valsartan tablets What is this medicine? VALSARTAN (val SAR tan) is used to treat high blood pressure. This drug is also used to treat patients with heart failure and patients who have had a heart attack. This medicine may be used for other purposes; ask your health care provider or pharmacist if you have questions. COMMON BRAND NAME(S): Diovan What should I tell my health care provider  before I take this medicine? They need to know if you have any of these conditions:  heart failure  kidney disease  liver disease  an unusual or allergic reaction to valsartan, other medicines, foods, dyes, or preservatives  pregnant or trying to get pregnant  breast-feeding How should I use this medicine? Take this medicine by mouth with a glass of water. Follow the directions on the prescription label. This medicine can be taken with or without food. Take your medicine at regular intervals. Do not take it more often than directed. Talk to your pediatrician regarding the use of this medicine in children. While this drug may be prescribed for children as young as 6 years for selected conditions, precautions do apply. Overdosage: If you think you have taken too much of this medicine contact a poison control center or emergency room at once. NOTE: This medicine is only for you. Do not share this medicine with others. What if I miss a dose? If you miss a dose, take it as soon as you can. If it is almost time for your next dose, take only that dose. Do not take double or extra doses. What may interact with this medicine?  blood pressure medicines  lithium  diuretics, especially triamterene, spironolactone or amiloride  potassium salts or potassium supplements This list may not describe all possible interactions. Give your health care provider a list of all the medicines, herbs, non-prescription drugs, or dietary supplements you use. Also tell them if you smoke,  drink alcohol, or use illegal drugs. Some items may interact with your medicine. What should I watch for while using this medicine? Visit your doctor or health care professional for regular checks on your progress. Check your blood pressure as directed. Ask your doctor or health care professional what your blood pressure should be and when you should contact him or her. Call your doctor or health care professional if you notice an  irregular or fast heart beat. Women should inform their doctor if they wish to become pregnant or think they might be pregnant. There is a potential for serious side effects to an unborn child, particularly in the second or third trimester. Talk to your health care professional or pharmacist for more information. You may get drowsy or dizzy. Do not drive, use machinery, or do anything that needs mental alertness until you know how this drug affects you. Do not stand or sit up quickly, especially if you are an older patient. This reduces the risk of dizzy or fainting spells. Alcohol can make you more drowsy and dizzy. Avoid alcoholic drinks. Avoid salt substitutes unless you are told otherwise by your doctor or health care professional. Do not treat yourself for coughs, colds, or pain while you are taking this medicine without asking your doctor or health care professional for advice. Some ingredients may increase your blood pressure. What side effects may I notice from receiving this medicine? Side effects that you should report to your doctor or health care professional as soon as possible:  confusion, dizziness, light headedness or fainting spells  decreased amount of urine passed  difficulty breathing or swallowing, hoarseness, or tightening of the throat  fast or irregular heart beat, palpitations, or chest pain  skin rash, itching  swelling of your face, lips, tongue, hands, or feet Side effects that usually do not require medical attention (report to your doctor or health care professional if they continue or are bothersome):  cough  decreased sexual function  headache  nausea or stomach pain This list may not describe all possible side effects. Call your doctor for medical advice about side effects. You may report side effects to FDA at 1-800-FDA-1088. Where should I keep my medicine? Keep out of the reach of children. Store at room temperature between 15 and 30 degrees C (59  and 86 degrees F). Keep your medicine container tightly closed and protect from moisture. Throw away any unused medicine after the expiration date. NOTE: This sheet is a summary. It may not cover all possible information. If you have questions about this medicine, talk to your doctor, pharmacist, or health care provider.  2020 Elsevier/Gold Standard (2012-10-05 12:39:59) Nitroglycerin sublingual tablets What is this medicine? NITROGLYCERIN (nye troe GLI ser in) is a type of vasodilator. It relaxes blood vessels, increasing the blood and oxygen supply to your heart. This medicine is used to relieve chest pain caused by angina. It is also used to prevent chest pain before activities like climbing stairs, going outdoors in cold weather, or sexual activity. This medicine may be used for other purposes; ask your health care provider or pharmacist if you have questions. COMMON BRAND NAME(S): Nitroquick, Nitrostat, Nitrotab What should I tell my health care provider before I take this medicine? They need to know if you have any of these conditions:  anemia  head injury, recent stroke, or bleeding in the brain  liver disease  previous heart attack  an unusual or allergic reaction to nitroglycerin, other medicines, foods, dyes, or preservatives  pregnant or trying to get pregnant  breast-feeding How should I use this medicine? Take this medicine by mouth as needed. At the first sign of an angina attack (chest pain or tightness) place one tablet under your tongue. You can also take this medicine 5 to 10 minutes before an event likely to produce chest pain. Follow the directions on the prescription label. Let the tablet dissolve under the tongue. Do not swallow whole. Replace the dose if you accidentally swallow it. It will help if your mouth is not dry. Saliva around the tablet will help it to dissolve more quickly. Do not eat or drink, smoke or chew tobacco while a tablet is dissolving. If you are  not better within 5 minutes after taking ONE dose of nitroglycerin, call 9-1-1 immediately to seek emergency medical care. Do not take more than 3 nitroglycerin tablets over 15 minutes. If you take this medicine often to relieve symptoms of angina, your doctor or health care professional may provide you with different instructions to manage your symptoms. If symptoms do not go away after following these instructions, it is important to call 9-1-1 immediately. Do not take more than 3 nitroglycerin tablets over 15 minutes. Talk to your pediatrician regarding the use of this medicine in children. Special care may be needed. Overdosage: If you think you have taken too much of this medicine contact a poison control center or emergency room at once. NOTE: This medicine is only for you. Do not share this medicine with others. What if I miss a dose? This does not apply. This medicine is only used as needed. What may interact with this medicine? Do not take this medicine with any of the following medications:  certain migraine medicines like ergotamine and dihydroergotamine (DHE)  medicines used to treat erectile dysfunction like sildenafil, tadalafil, and vardenafil  riociguat This medicine may also interact with the following medications:  alteplase  aspirin  heparin  medicines for high blood pressure  medicines for mental depression  other medicines used to treat angina  phenothiazines like chlorpromazine, mesoridazine, prochlorperazine, thioridazine This list may not describe all possible interactions. Give your health care provider a list of all the medicines, herbs, non-prescription drugs, or dietary supplements you use. Also tell them if you smoke, drink alcohol, or use illegal drugs. Some items may interact with your medicine. What should I watch for while using this medicine? Tell your doctor or health care professional if you feel your medicine is no longer working. Keep this  medicine with you at all times. Sit or lie down when you take your medicine to prevent falling if you feel dizzy or faint after using it. Try to remain calm. This will help you to feel better faster. If you feel dizzy, take several deep breaths and lie down with your feet propped up, or bend forward with your head resting between your knees. You may get drowsy or dizzy. Do not drive, use machinery, or do anything that needs mental alertness until you know how this drug affects you. Do not stand or sit up quickly, especially if you are an older patient. This reduces the risk of dizzy or fainting spells. Alcohol can make you more drowsy and dizzy. Avoid alcoholic drinks. Do not treat yourself for coughs, colds, or pain while you are taking this medicine without asking your doctor or health care professional for advice. Some ingredients may increase your blood pressure. What side effects may I notice from receiving this medicine? Side effects that  you should report to your doctor or health care professional as soon as possible:  blurred vision  dry mouth  skin rash  sweating  the feeling of extreme pressure in the head  unusually weak or tired Side effects that usually do not require medical attention (report to your doctor or health care professional if they continue or are bothersome):  flushing of the face or neck  headache  irregular heartbeat, palpitations  nausea, vomiting This list may not describe all possible side effects. Call your doctor for medical advice about side effects. You may report side effects to FDA at 1-800-FDA-1088. Where should I keep my medicine? Keep out of the reach of children. Store at room temperature between 20 and 25 degrees C (68 and 77 degrees F). Store in Chief of Staff. Protect from light and moisture. Keep tightly closed. Throw away any unused medicine after the expiration date. NOTE: This sheet is a summary. It may not cover all possible  information. If you have questions about this medicine, talk to your doctor, pharmacist, or health care provider.  2020 Elsevier/Gold Standard (2013-05-03 17:57:36)

## 2019-05-08 ENCOUNTER — Other Ambulatory Visit: Payer: Self-pay

## 2019-05-08 MED ORDER — CLOPIDOGREL BISULFATE 75 MG PO TABS: 75.0000 mg | ORAL_TABLET | Freq: Every day | ORAL | 1 refills | Status: DC

## 2019-05-15 DIAGNOSIS — I2510 Atherosclerotic heart disease of native coronary artery without angina pectoris: Secondary | ICD-10-CM | POA: Diagnosis not present

## 2019-05-15 DIAGNOSIS — E785 Hyperlipidemia, unspecified: Secondary | ICD-10-CM | POA: Diagnosis not present

## 2019-05-15 DIAGNOSIS — I10 Essential (primary) hypertension: Secondary | ICD-10-CM | POA: Diagnosis not present

## 2019-05-16 ENCOUNTER — Telehealth: Payer: Self-pay

## 2019-05-16 LAB — BASIC METABOLIC PANEL
BUN/Creatinine Ratio: 14 (ref 10–24)
BUN: 13 mg/dL (ref 8–27)
CO2: 26 mmol/L (ref 20–29)
Calcium: 9.2 mg/dL (ref 8.6–10.2)
Chloride: 103 mmol/L (ref 96–106)
Creatinine, Ser: 0.93 mg/dL (ref 0.76–1.27)
GFR calc Af Amer: 95 mL/min/{1.73_m2} (ref >59)
GFR calc non Af Amer: 82 mL/min/{1.73_m2} (ref >59)
Glucose: 101 mg/dL — ABNORMAL HIGH (ref 65–99)
Potassium: 4.7 mmol/L (ref 3.5–5.2)
Sodium: 141 mmol/L (ref 134–144)

## 2019-05-16 LAB — HEPATIC FUNCTION PANEL
ALT: 22 IU/L (ref 0–44)
AST: 28 IU/L (ref 0–40)
Albumin: 4.6 g/dL (ref 3.7–4.7)
Alkaline Phosphatase: 111 IU/L (ref 39–117)
Bilirubin Total: 0.5 mg/dL (ref 0.0–1.2)
Bilirubin, Direct: 0.17 mg/dL (ref 0.00–0.40)
Total Protein: 6.6 g/dL (ref 6.0–8.5)

## 2019-05-16 NOTE — Telephone Encounter (Signed)
-----   Message from Jenean Lindau, MD sent at 05/16/2019  8:47 AM EDT ----- The results of the study is unremarkable. Please inform patient. I will discuss in detail at next appointment. Cc  primary care/referring physician Jenean Lindau, MD 05/16/2019 8:47 AM

## 2019-05-16 NOTE — Telephone Encounter (Signed)
vm not setup yet, copy to Dr. Delice Lesch. Will try to reach patient at later time.

## 2019-05-17 NOTE — Telephone Encounter (Signed)
Results relayed, no further questions. 

## 2019-06-05 DIAGNOSIS — Z139 Encounter for screening, unspecified: Secondary | ICD-10-CM | POA: Diagnosis not present

## 2019-06-05 DIAGNOSIS — Z7189 Other specified counseling: Secondary | ICD-10-CM | POA: Diagnosis not present

## 2019-06-05 DIAGNOSIS — Z1339 Encounter for screening examination for other mental health and behavioral disorders: Secondary | ICD-10-CM | POA: Diagnosis not present

## 2019-06-05 DIAGNOSIS — Z136 Encounter for screening for cardiovascular disorders: Secondary | ICD-10-CM | POA: Diagnosis not present

## 2019-06-05 DIAGNOSIS — R69 Illness, unspecified: Secondary | ICD-10-CM | POA: Diagnosis not present

## 2019-06-05 DIAGNOSIS — Z0000 Encounter for general adult medical examination without abnormal findings: Secondary | ICD-10-CM | POA: Diagnosis not present

## 2019-06-05 DIAGNOSIS — Z1331 Encounter for screening for depression: Secondary | ICD-10-CM | POA: Diagnosis not present

## 2019-08-03 ENCOUNTER — Encounter: Payer: Self-pay | Admitting: *Deleted

## 2019-08-03 ENCOUNTER — Encounter: Payer: Self-pay | Admitting: Cardiology

## 2019-08-03 ENCOUNTER — Ambulatory Visit (INDEPENDENT_AMBULATORY_CARE_PROVIDER_SITE_OTHER): Payer: Medicare HMO | Admitting: Cardiology

## 2019-08-03 ENCOUNTER — Other Ambulatory Visit: Payer: Self-pay

## 2019-08-03 VITALS — BP 150/96 | HR 80 | Ht 71.0 in | Wt 162.0 lb

## 2019-08-03 DIAGNOSIS — F17210 Nicotine dependence, cigarettes, uncomplicated: Secondary | ICD-10-CM | POA: Insufficient documentation

## 2019-08-03 DIAGNOSIS — I10 Essential (primary) hypertension: Secondary | ICD-10-CM | POA: Diagnosis not present

## 2019-08-03 DIAGNOSIS — I2510 Atherosclerotic heart disease of native coronary artery without angina pectoris: Secondary | ICD-10-CM

## 2019-08-03 DIAGNOSIS — E785 Hyperlipidemia, unspecified: Secondary | ICD-10-CM | POA: Diagnosis not present

## 2019-08-03 DIAGNOSIS — R69 Illness, unspecified: Secondary | ICD-10-CM | POA: Diagnosis not present

## 2019-08-03 HISTORY — DX: Nicotine dependence, cigarettes, uncomplicated: F17.210

## 2019-08-03 NOTE — Patient Instructions (Signed)
Medication Instructions:  Your physician recommends that you continue on your current medications as directed. Please refer to the Current Medication list given to you today.  *If you need a refill on your cardiac medications before your next appointment, please call your pharmacy*  Lab Work: None.  If you have labs (blood work) drawn today and your tests are completely normal, you will receive your results only by: . MyChart Message (if you have MyChart) OR . A paper copy in the mail If you have any lab test that is abnormal or we need to change your treatment, we will call you to review the results.  Testing/Procedures: None.   Follow-Up: At [facility], you and your health needs are our priority.  As part of our continuing mission to provide you with exceptional heart care, we have created designated Provider Care Teams.  These Care Teams include your primary Cardiologist (physician) and Advanced Practice Providers (APPs -  Physician Assistants and Nurse Practitioners) who all work together to provide you with the care you need, when you need it.  Your next appointment:   6 month(s)  The format for your next appointment:   In Person  Provider:   Rajan Revankar, MD  Other Instructions   

## 2019-08-03 NOTE — Progress Notes (Signed)
Cardiology Office Note:    Date:  08/03/2019   ID:  Ian Gardner, DOB 12/14/1945, MRN [id number]  PCP:  Dortha Kern, PA  Cardiologist:  Jenean Lindau, MD   Referring MD: Dortha Kern, [place]    ASSESSMENT:    1. Coronary artery disease involving native coronary artery of native heart without angina pectoris   2. Essential hypertension   3. Dyslipidemia   4. Cigarette smoker    PLAN:    In order of problems listed above:  1. Coronary artery disease: Secondary prevention stressed with the patient.  Importance of compliance with diet and medication stressed and he vocalized understanding. 2. Essential hypertension: Blood pressure is elevated.  He feels he has an element of whitecoat hypertension.  He tells me that he will buy a machine and check blood pressures at home twice a day and put it for me in the mail a week or so 3. Mixed dyslipidemia: He had blood work recently at primary care and will get a copy 4. Cigarette smoker: I spent 5 minutes with the patient discussing solely about smoking. Smoking cessation was counseled. I suggested to the patient also different medications and pharmacological interventions. Patient is keen to try stopping on its own at this time. He will get back to me if he needs any further assistance in this matter. 5. Patient will be seen in follow-up appointment in 6 months or earlier if the patient has any concerns    Medication Adjustments/Labs and Tests Ordered: Current medicines are reviewed at length with the patient today.  Concerns regarding medicines are outlined above.  No orders of the defined types were placed in this encounter.  No orders of the defined types were placed in this encounter.    Chief Complaint  Patient presents with  . Follow-up     History of Present Illness:    Ian Gardner is a 74 y.o. male.  Patient has past medical history of coronary artery disease, essential hypertension and dyslipidemia.   Unfortunately continues to smoke and leads a sedentary lifestyle.  He is a Engineer, drilling.  He denies any chest pain orthopnea or PND.  At the time of my evaluation, the patient is alert awake oriented and in no distress.  Past Medical History:  Diagnosis Date  . Coronary artery disease involving native coronary artery of native heart without angina pectoris 05/22/2015   Overview:  Old myocardial infarction Overview:  Overview:  Old myocardial infarction  . Dyslipidemia 05/22/2015  . Erectile dysfunction due to arterial insufficiency 05/22/2015  . Essential hypertension 05/22/2015  . Scalp mass 08/30/2017    Current Medications: Current Meds  Medication Sig  . atorvastatin (LIPITOR) 40 MG tablet Take 1 tablet (40 mg total) by mouth daily. NEED OFFICE VISIT FOR MORE REFILLS  . clopidogrel (PLAVIX) 75 MG tablet Take 1 tablet (75 mg total) by mouth daily.  . metoprolol succinate (TOPROL-XL) 25 MG 24 hr tablet Take 25 mg by mouth daily.  . metoprolol tartrate (LOPRESSOR) 50 MG tablet Take 25 mg by mouth daily.  . Multiple Vitamins-Minerals (ICAPS AREDS FORMULA PO) Take by mouth daily.  . nitroGLYCERIN (NITROSTAT) 0.4 MG SL tablet Place 1 tablet (0.4 mg total) under the tongue every 5 (five) minutes as needed.  . valsartan (DIOVAN) 160 MG tablet Take 1 tablet (160 mg total) by mouth daily.     Allergies:   Patient has no known allergies.   Social History   Socioeconomic History  . Marital status:  Married    Spouse name: Not on file  . Number of children: Not on file  . Years of education: Not on file  . Highest education level: Not on file  Occupational History  . Not on file  Tobacco Use  . Smoking status: Current Some Day Smoker  . Smokeless tobacco: Never Used  Substance and Sexual Activity  . Alcohol use: Yes  . Drug use: No  . Sexual activity: Not on file  Other Topics Concern  . Not on file  Social History Narrative  . Not on file   Social Determinants of Health   Financial  Resource Strain:   . Difficulty of Paying Living Expenses: Not on file  Food Insecurity:   . Worried About Charity fundraiser in the Last Year: Not on file  . Ran Out of Food in the Last Year: Not on file  Transportation Needs:   . Lack of Transportation (Medical): Not on file  . Lack of Transportation (Non-Medical): Not on file  Physical Activity:   . Days of Exercise per Week: Not on file  . Minutes of Exercise per Session: Not on file  Stress:   . Feeling of Stress : Not on file  Social Connections:   . Frequency of Communication with Friends and Family: Not on file  . Frequency of Social Gatherings with Friends and Family: Not on file  . Attends Religious Services: Not on file  . Active Member of Clubs or Organizations: Not on file  . Attends Archivist Meetings: Not on file  . Marital Status: Not on file     Family History: The patient's family history includes Cancer in his maternal grandmother; Heart disease in his father; Stroke in his father.  ROS:   Please see the history of present illness.    All other systems reviewed and are negative.  EKGs/Labs/Other Studies Reviewed:    The following studies were reviewed today: I discussed findings of the echocardiogram with the patient at length   Recent Labs: 05/15/2019: ALT 22; BUN 13; Creatinine, Ser 0.93; Potassium 4.7; Sodium 141  Recent Lipid Panel No results found for: CHOL, TRIG, HDL, CHOLHDL, VLDL, LDLCALC, LDLDIRECT  Physical Exam:    VS:  BP (!) 150/96 (BP Location: Left Arm, Patient Position: Sitting, Cuff Size: Normal)   Pulse 80   Ht 5\' 11"  (1.803 m)   Wt 162 lb (73.5 kg)   SpO2 97%   BMI 22.59 kg/m     Wt Readings from Last 3 Encounters:  08/03/19 162 lb (73.5 kg)  05/01/19 161 lb 6.4 oz (73.2 kg)  09/15/17 165 lb 12.8 oz (75.2 kg)     GEN: Patient is in no acute distress HEENT: Normal NECK: No JVD; No carotid bruits LYMPHATICS: No lymphadenopathy CARDIAC: Hear sounds regular, 2/6  systolic murmur at the apex. RESPIRATORY:  Clear to auscultation without rales, wheezing or rhonchi  ABDOMEN: Soft, non-tender, non-distended MUSCULOSKELETAL:  No edema; No deformity  SKIN: Warm and dry NEUROLOGIC:  Alert and oriented x 3 PSYCHIATRIC:  Normal affect   Signed, Jenean Lindau, MD  08/03/2019 8:33 AM    [place]

## 2019-09-26 DIAGNOSIS — E785 Hyperlipidemia, unspecified: Secondary | ICD-10-CM | POA: Diagnosis not present

## 2019-09-26 DIAGNOSIS — I119 Hypertensive heart disease without heart failure: Secondary | ICD-10-CM | POA: Diagnosis not present

## 2019-10-22 DIAGNOSIS — I2510 Atherosclerotic heart disease of native coronary artery without angina pectoris: Secondary | ICD-10-CM | POA: Diagnosis not present

## 2019-10-22 DIAGNOSIS — E785 Hyperlipidemia, unspecified: Secondary | ICD-10-CM | POA: Diagnosis not present

## 2019-10-22 DIAGNOSIS — Z139 Encounter for screening, unspecified: Secondary | ICD-10-CM | POA: Diagnosis not present

## 2019-10-22 DIAGNOSIS — I119 Hypertensive heart disease without heart failure: Secondary | ICD-10-CM | POA: Diagnosis not present

## 2019-10-31 ENCOUNTER — Other Ambulatory Visit: Payer: Self-pay | Admitting: Cardiology

## 2019-11-29 ENCOUNTER — Other Ambulatory Visit: Payer: Self-pay | Admitting: Cardiology

## 2020-04-21 DIAGNOSIS — E785 Hyperlipidemia, unspecified: Secondary | ICD-10-CM | POA: Diagnosis not present

## 2020-04-21 DIAGNOSIS — Z125 Encounter for screening for malignant neoplasm of prostate: Secondary | ICD-10-CM | POA: Diagnosis not present

## 2020-04-28 DIAGNOSIS — I2510 Atherosclerotic heart disease of native coronary artery without angina pectoris: Secondary | ICD-10-CM | POA: Diagnosis not present

## 2020-04-28 DIAGNOSIS — R7301 Impaired fasting glucose: Secondary | ICD-10-CM | POA: Diagnosis not present

## 2020-04-28 DIAGNOSIS — E785 Hyperlipidemia, unspecified: Secondary | ICD-10-CM | POA: Diagnosis not present

## 2020-04-28 DIAGNOSIS — I119 Hypertensive heart disease without heart failure: Secondary | ICD-10-CM | POA: Diagnosis not present

## 2020-05-12 DIAGNOSIS — I119 Hypertensive heart disease without heart failure: Secondary | ICD-10-CM | POA: Diagnosis not present

## 2020-05-12 DIAGNOSIS — R7303 Prediabetes: Secondary | ICD-10-CM | POA: Diagnosis not present

## 2020-05-12 DIAGNOSIS — Z6822 Body mass index (BMI) 22.0-22.9, adult: Secondary | ICD-10-CM | POA: Diagnosis not present

## 2020-05-12 DIAGNOSIS — I2510 Atherosclerotic heart disease of native coronary artery without angina pectoris: Secondary | ICD-10-CM | POA: Diagnosis not present

## 2020-06-10 DIAGNOSIS — J069 Acute upper respiratory infection, unspecified: Secondary | ICD-10-CM | POA: Diagnosis not present

## 2020-06-12 ENCOUNTER — Other Ambulatory Visit: Payer: Self-pay | Admitting: Cardiology

## 2020-06-16 NOTE — Telephone Encounter (Signed)
Rx request sent to pharmacy.  

## 2020-07-10 ENCOUNTER — Other Ambulatory Visit: Payer: Self-pay | Admitting: Cardiology

## 2020-08-01 ENCOUNTER — Other Ambulatory Visit: Payer: Self-pay | Admitting: Cardiology

## 2020-08-01 NOTE — Telephone Encounter (Signed)
Rx refill sent to pharmacy. 

## 2020-08-25 DIAGNOSIS — H353211 Exudative age-related macular degeneration, right eye, with active choroidal neovascularization: Secondary | ICD-10-CM | POA: Diagnosis not present

## 2020-09-25 DIAGNOSIS — H353124 Nonexudative age-related macular degeneration, left eye, advanced atrophic with subfoveal involvement: Secondary | ICD-10-CM | POA: Diagnosis not present

## 2020-09-25 DIAGNOSIS — H353211 Exudative age-related macular degeneration, right eye, with active choroidal neovascularization: Secondary | ICD-10-CM | POA: Diagnosis not present

## 2020-09-30 DIAGNOSIS — M5417 Radiculopathy, lumbosacral region: Secondary | ICD-10-CM | POA: Diagnosis not present

## 2020-09-30 DIAGNOSIS — M5451 Vertebrogenic low back pain: Secondary | ICD-10-CM | POA: Diagnosis not present

## 2020-09-30 DIAGNOSIS — M9905 Segmental and somatic dysfunction of pelvic region: Secondary | ICD-10-CM | POA: Diagnosis not present

## 2020-09-30 DIAGNOSIS — M9903 Segmental and somatic dysfunction of lumbar region: Secondary | ICD-10-CM | POA: Diagnosis not present

## 2020-09-30 DIAGNOSIS — M9902 Segmental and somatic dysfunction of thoracic region: Secondary | ICD-10-CM | POA: Diagnosis not present

## 2020-09-30 DIAGNOSIS — M461 Sacroiliitis, not elsewhere classified: Secondary | ICD-10-CM | POA: Diagnosis not present

## 2020-10-03 DIAGNOSIS — M5451 Vertebrogenic low back pain: Secondary | ICD-10-CM | POA: Diagnosis not present

## 2020-10-03 DIAGNOSIS — M9902 Segmental and somatic dysfunction of thoracic region: Secondary | ICD-10-CM | POA: Diagnosis not present

## 2020-10-03 DIAGNOSIS — M461 Sacroiliitis, not elsewhere classified: Secondary | ICD-10-CM | POA: Diagnosis not present

## 2020-10-03 DIAGNOSIS — M9903 Segmental and somatic dysfunction of lumbar region: Secondary | ICD-10-CM | POA: Diagnosis not present

## 2020-10-03 DIAGNOSIS — M5417 Radiculopathy, lumbosacral region: Secondary | ICD-10-CM | POA: Diagnosis not present

## 2020-10-03 DIAGNOSIS — M9905 Segmental and somatic dysfunction of pelvic region: Secondary | ICD-10-CM | POA: Diagnosis not present

## 2020-10-23 DIAGNOSIS — H353124 Nonexudative age-related macular degeneration, left eye, advanced atrophic with subfoveal involvement: Secondary | ICD-10-CM | POA: Diagnosis not present

## 2020-10-23 DIAGNOSIS — H353211 Exudative age-related macular degeneration, right eye, with active choroidal neovascularization: Secondary | ICD-10-CM | POA: Diagnosis not present

## 2020-10-23 DIAGNOSIS — H1131 Conjunctival hemorrhage, right eye: Secondary | ICD-10-CM | POA: Diagnosis not present

## 2020-11-03 DIAGNOSIS — M545 Low back pain, unspecified: Secondary | ICD-10-CM | POA: Diagnosis not present

## 2020-11-03 DIAGNOSIS — M5416 Radiculopathy, lumbar region: Secondary | ICD-10-CM | POA: Diagnosis not present

## 2020-11-27 DIAGNOSIS — H353211 Exudative age-related macular degeneration, right eye, with active choroidal neovascularization: Secondary | ICD-10-CM | POA: Diagnosis not present

## 2020-12-31 ENCOUNTER — Other Ambulatory Visit: Payer: Self-pay | Admitting: Cardiology

## 2020-12-31 NOTE — Telephone Encounter (Signed)
Valsartan 160 mg # 90 with message to patient needs appointment for further refills

## 2021-02-06 ENCOUNTER — Other Ambulatory Visit: Payer: Self-pay | Admitting: Cardiology

## 2021-02-06 NOTE — Telephone Encounter (Signed)
Clopidogrel 75 mg # 90 only with message for patient needs appointment for further refills [facility]  [id number] [place]

## 2021-03-31 ENCOUNTER — Other Ambulatory Visit: Payer: Self-pay | Admitting: Cardiology

## 2021-03-31 DIAGNOSIS — I2510 Atherosclerotic heart disease of native coronary artery without angina pectoris: Secondary | ICD-10-CM | POA: Diagnosis not present

## 2021-03-31 DIAGNOSIS — I10 Essential (primary) hypertension: Secondary | ICD-10-CM | POA: Diagnosis not present

## 2021-03-31 DIAGNOSIS — Z7902 Long term (current) use of antithrombotics/antiplatelets: Secondary | ICD-10-CM | POA: Diagnosis not present

## 2021-03-31 DIAGNOSIS — E785 Hyperlipidemia, unspecified: Secondary | ICD-10-CM | POA: Diagnosis not present

## 2021-03-31 DIAGNOSIS — Z8249 Family history of ischemic heart disease and other diseases of the circulatory system: Secondary | ICD-10-CM | POA: Diagnosis not present

## 2021-03-31 DIAGNOSIS — I252 Old myocardial infarction: Secondary | ICD-10-CM | POA: Diagnosis not present

## 2021-03-31 DIAGNOSIS — N529 Male erectile dysfunction, unspecified: Secondary | ICD-10-CM | POA: Diagnosis not present

## 2021-03-31 DIAGNOSIS — R69 Illness, unspecified: Secondary | ICD-10-CM | POA: Diagnosis not present

## 2021-03-31 NOTE — Telephone Encounter (Signed)
Valsartan 160 mg # 30 tablets only with message patient needs appointment for further refills / 2nd attempt [facility]  [id number] [place]

## 2021-04-25 ENCOUNTER — Other Ambulatory Visit: Payer: Self-pay | Admitting: Cardiology

## 2021-04-30 DIAGNOSIS — H353210 Exudative age-related macular degeneration, right eye, stage unspecified: Secondary | ICD-10-CM | POA: Diagnosis not present

## 2021-04-30 DIAGNOSIS — H353124 Nonexudative age-related macular degeneration, left eye, advanced atrophic with subfoveal involvement: Secondary | ICD-10-CM | POA: Diagnosis not present

## 2021-05-04 ENCOUNTER — Other Ambulatory Visit: Payer: Self-pay | Admitting: Cardiology

## 2021-05-04 NOTE — Telephone Encounter (Signed)
Clopidogrel 75 mg tablet # 30 only with message for patient needs appointment for future refill / 2nd attempt  Sent to  [id number] [place], [place].

## 2021-05-17 ENCOUNTER — Other Ambulatory Visit: Payer: Self-pay | Admitting: Cardiology

## 2021-05-28 DIAGNOSIS — E785 Hyperlipidemia, unspecified: Secondary | ICD-10-CM | POA: Diagnosis not present

## 2021-05-28 DIAGNOSIS — R7303 Prediabetes: Secondary | ICD-10-CM | POA: Diagnosis not present

## 2021-05-28 DIAGNOSIS — Z125 Encounter for screening for malignant neoplasm of prostate: Secondary | ICD-10-CM | POA: Diagnosis not present

## 2021-06-01 ENCOUNTER — Other Ambulatory Visit: Payer: Self-pay | Admitting: Cardiology

## 2021-06-05 ENCOUNTER — Other Ambulatory Visit: Payer: Self-pay | Admitting: Cardiology

## 2021-06-05 DIAGNOSIS — R69 Illness, unspecified: Secondary | ICD-10-CM | POA: Diagnosis not present

## 2021-06-05 DIAGNOSIS — Z139 Encounter for screening, unspecified: Secondary | ICD-10-CM | POA: Diagnosis not present

## 2021-06-05 DIAGNOSIS — R7303 Prediabetes: Secondary | ICD-10-CM | POA: Diagnosis not present

## 2021-06-05 DIAGNOSIS — I2510 Atherosclerotic heart disease of native coronary artery without angina pectoris: Secondary | ICD-10-CM | POA: Diagnosis not present

## 2021-06-05 DIAGNOSIS — Z1331 Encounter for screening for depression: Secondary | ICD-10-CM | POA: Diagnosis not present

## 2021-06-05 DIAGNOSIS — F17210 Nicotine dependence, cigarettes, uncomplicated: Secondary | ICD-10-CM | POA: Diagnosis not present

## 2021-06-05 DIAGNOSIS — I119 Hypertensive heart disease without heart failure: Secondary | ICD-10-CM | POA: Diagnosis not present

## 2021-06-05 DIAGNOSIS — Z789 Other specified health status: Secondary | ICD-10-CM | POA: Diagnosis not present

## 2021-06-05 DIAGNOSIS — E785 Hyperlipidemia, unspecified: Secondary | ICD-10-CM | POA: Diagnosis not present

## 2021-06-05 DIAGNOSIS — Z0000 Encounter for general adult medical examination without abnormal findings: Secondary | ICD-10-CM | POA: Diagnosis not present

## 2021-06-05 DIAGNOSIS — Z1339 Encounter for screening examination for other mental health and behavioral disorders: Secondary | ICD-10-CM | POA: Diagnosis not present

## 2021-06-18 ENCOUNTER — Other Ambulatory Visit: Payer: Self-pay | Admitting: Cardiology

## 2021-06-18 DIAGNOSIS — I119 Hypertensive heart disease without heart failure: Secondary | ICD-10-CM | POA: Diagnosis not present

## 2021-06-18 DIAGNOSIS — R7303 Prediabetes: Secondary | ICD-10-CM | POA: Diagnosis not present

## 2021-06-18 DIAGNOSIS — H353211 Exudative age-related macular degeneration, right eye, with active choroidal neovascularization: Secondary | ICD-10-CM | POA: Diagnosis not present

## 2021-06-21 DIAGNOSIS — R310 Gross hematuria: Secondary | ICD-10-CM | POA: Diagnosis not present

## 2021-06-23 DIAGNOSIS — R310 Gross hematuria: Secondary | ICD-10-CM | POA: Diagnosis not present

## 2021-06-23 DIAGNOSIS — Z6821 Body mass index (BMI) 21.0-21.9, adult: Secondary | ICD-10-CM | POA: Diagnosis not present

## 2021-06-23 DIAGNOSIS — R69 Illness, unspecified: Secondary | ICD-10-CM | POA: Diagnosis not present

## 2021-06-26 ENCOUNTER — Other Ambulatory Visit: Payer: Self-pay | Admitting: Cardiology

## 2021-07-05 ENCOUNTER — Other Ambulatory Visit: Payer: Self-pay | Admitting: Cardiology

## 2021-07-07 DIAGNOSIS — K838 Other specified diseases of biliary tract: Secondary | ICD-10-CM | POA: Diagnosis not present

## 2021-07-07 DIAGNOSIS — N281 Cyst of kidney, acquired: Secondary | ICD-10-CM | POA: Diagnosis not present

## 2021-07-07 DIAGNOSIS — K5790 Diverticulosis of intestine, part unspecified, without perforation or abscess without bleeding: Secondary | ICD-10-CM | POA: Diagnosis not present

## 2021-07-07 DIAGNOSIS — R310 Gross hematuria: Secondary | ICD-10-CM | POA: Diagnosis not present

## 2021-07-09 DIAGNOSIS — R590 Localized enlarged lymph nodes: Secondary | ICD-10-CM | POA: Diagnosis not present

## 2021-07-09 DIAGNOSIS — K5710 Diverticulosis of small intestine without perforation or abscess without bleeding: Secondary | ICD-10-CM | POA: Diagnosis not present

## 2021-07-09 DIAGNOSIS — R310 Gross hematuria: Secondary | ICD-10-CM | POA: Diagnosis not present

## 2021-07-09 DIAGNOSIS — N401 Enlarged prostate with lower urinary tract symptoms: Secondary | ICD-10-CM | POA: Diagnosis not present

## 2021-07-09 DIAGNOSIS — Z79899 Other long term (current) drug therapy: Secondary | ICD-10-CM | POA: Diagnosis not present

## 2021-07-09 DIAGNOSIS — N529 Male erectile dysfunction, unspecified: Secondary | ICD-10-CM | POA: Diagnosis not present

## 2021-07-11 ENCOUNTER — Other Ambulatory Visit: Payer: Self-pay | Admitting: Cardiology

## 2021-07-14 ENCOUNTER — Other Ambulatory Visit: Payer: Self-pay | Admitting: Cardiology

## 2021-07-14 NOTE — Telephone Encounter (Signed)
Plavix # 15 only , Needs appointment for future refills, final attempt message sent to patient and pharmacy

## 2021-07-19 DIAGNOSIS — I119 Hypertensive heart disease without heart failure: Secondary | ICD-10-CM | POA: Diagnosis not present

## 2021-07-19 DIAGNOSIS — I2510 Atherosclerotic heart disease of native coronary artery without angina pectoris: Secondary | ICD-10-CM | POA: Diagnosis not present

## 2021-07-19 DIAGNOSIS — E785 Hyperlipidemia, unspecified: Secondary | ICD-10-CM | POA: Diagnosis not present

## 2021-07-21 ENCOUNTER — Other Ambulatory Visit: Payer: Self-pay | Admitting: Cardiology

## 2021-07-22 DIAGNOSIS — Z139 Encounter for screening, unspecified: Secondary | ICD-10-CM | POA: Diagnosis not present

## 2021-07-22 DIAGNOSIS — Z1331 Encounter for screening for depression: Secondary | ICD-10-CM | POA: Diagnosis not present

## 2021-07-22 DIAGNOSIS — I119 Hypertensive heart disease without heart failure: Secondary | ICD-10-CM | POA: Diagnosis not present

## 2021-07-22 DIAGNOSIS — Z6821 Body mass index (BMI) 21.0-21.9, adult: Secondary | ICD-10-CM | POA: Diagnosis not present

## 2021-07-30 DIAGNOSIS — I119 Hypertensive heart disease without heart failure: Secondary | ICD-10-CM | POA: Diagnosis not present

## 2021-08-05 DIAGNOSIS — N401 Enlarged prostate with lower urinary tract symptoms: Secondary | ICD-10-CM | POA: Diagnosis not present

## 2021-08-05 DIAGNOSIS — R590 Localized enlarged lymph nodes: Secondary | ICD-10-CM | POA: Diagnosis not present

## 2021-08-05 DIAGNOSIS — R310 Gross hematuria: Secondary | ICD-10-CM | POA: Diagnosis not present

## 2021-08-07 ENCOUNTER — Other Ambulatory Visit: Payer: Self-pay | Admitting: Cardiology

## 2021-08-16 DIAGNOSIS — I119 Hypertensive heart disease without heart failure: Secondary | ICD-10-CM | POA: Diagnosis not present

## 2021-08-19 ENCOUNTER — Other Ambulatory Visit: Payer: Self-pay | Admitting: Cardiology

## 2021-08-19 DIAGNOSIS — I2510 Atherosclerotic heart disease of native coronary artery without angina pectoris: Secondary | ICD-10-CM | POA: Diagnosis not present

## 2021-08-19 DIAGNOSIS — I119 Hypertensive heart disease without heart failure: Secondary | ICD-10-CM | POA: Diagnosis not present

## 2021-08-19 DIAGNOSIS — E785 Hyperlipidemia, unspecified: Secondary | ICD-10-CM | POA: Diagnosis not present

## 2021-08-20 DIAGNOSIS — H353211 Exudative age-related macular degeneration, right eye, with active choroidal neovascularization: Secondary | ICD-10-CM | POA: Diagnosis not present

## 2021-08-21 DIAGNOSIS — I119 Hypertensive heart disease without heart failure: Secondary | ICD-10-CM | POA: Diagnosis not present

## 2021-08-21 DIAGNOSIS — R9431 Abnormal electrocardiogram [ECG] [EKG]: Secondary | ICD-10-CM | POA: Diagnosis not present

## 2021-08-22 ENCOUNTER — Other Ambulatory Visit: Payer: Self-pay | Admitting: Cardiology

## 2021-08-29 ENCOUNTER — Other Ambulatory Visit: Payer: Self-pay | Admitting: Cardiology

## 2021-09-15 DIAGNOSIS — I119 Hypertensive heart disease without heart failure: Secondary | ICD-10-CM | POA: Diagnosis not present

## 2021-09-16 DIAGNOSIS — E785 Hyperlipidemia, unspecified: Secondary | ICD-10-CM | POA: Diagnosis not present

## 2021-09-16 DIAGNOSIS — I119 Hypertensive heart disease without heart failure: Secondary | ICD-10-CM | POA: Diagnosis not present

## 2021-09-16 DIAGNOSIS — I2510 Atherosclerotic heart disease of native coronary artery without angina pectoris: Secondary | ICD-10-CM | POA: Diagnosis not present

## 2021-09-18 DIAGNOSIS — I119 Hypertensive heart disease without heart failure: Secondary | ICD-10-CM | POA: Diagnosis not present

## 2021-10-17 DIAGNOSIS — I119 Hypertensive heart disease without heart failure: Secondary | ICD-10-CM | POA: Diagnosis not present

## 2021-10-17 DIAGNOSIS — E785 Hyperlipidemia, unspecified: Secondary | ICD-10-CM | POA: Diagnosis not present

## 2021-11-07 ENCOUNTER — Other Ambulatory Visit: Payer: Self-pay | Admitting: Cardiology

## 2021-11-12 DIAGNOSIS — H353211 Exudative age-related macular degeneration, right eye, with active choroidal neovascularization: Secondary | ICD-10-CM | POA: Diagnosis not present

## 2021-11-14 ENCOUNTER — Other Ambulatory Visit: Payer: Self-pay | Admitting: Cardiology

## 2021-11-15 DIAGNOSIS — I119 Hypertensive heart disease without heart failure: Secondary | ICD-10-CM | POA: Diagnosis not present

## 2021-11-16 DIAGNOSIS — E785 Hyperlipidemia, unspecified: Secondary | ICD-10-CM | POA: Diagnosis not present

## 2021-11-16 DIAGNOSIS — I119 Hypertensive heart disease without heart failure: Secondary | ICD-10-CM | POA: Diagnosis not present

## 2021-11-30 DIAGNOSIS — S46012A Strain of muscle(s) and tendon(s) of the rotator cuff of left shoulder, initial encounter: Secondary | ICD-10-CM | POA: Diagnosis not present

## 2021-12-07 DIAGNOSIS — R7303 Prediabetes: Secondary | ICD-10-CM | POA: Diagnosis not present

## 2021-12-07 DIAGNOSIS — I119 Hypertensive heart disease without heart failure: Secondary | ICD-10-CM | POA: Diagnosis not present

## 2021-12-07 DIAGNOSIS — E785 Hyperlipidemia, unspecified: Secondary | ICD-10-CM | POA: Diagnosis not present

## 2021-12-16 DIAGNOSIS — E785 Hyperlipidemia, unspecified: Secondary | ICD-10-CM | POA: Diagnosis not present

## 2021-12-16 DIAGNOSIS — I119 Hypertensive heart disease without heart failure: Secondary | ICD-10-CM | POA: Diagnosis not present

## 2021-12-16 DIAGNOSIS — R7303 Prediabetes: Secondary | ICD-10-CM | POA: Diagnosis not present

## 2021-12-16 DIAGNOSIS — I2510 Atherosclerotic heart disease of native coronary artery without angina pectoris: Secondary | ICD-10-CM | POA: Diagnosis not present

## 2021-12-17 DIAGNOSIS — I119 Hypertensive heart disease without heart failure: Secondary | ICD-10-CM | POA: Diagnosis not present

## 2021-12-17 DIAGNOSIS — E785 Hyperlipidemia, unspecified: Secondary | ICD-10-CM | POA: Diagnosis not present

## 2022-01-15 DIAGNOSIS — I119 Hypertensive heart disease without heart failure: Secondary | ICD-10-CM | POA: Diagnosis not present

## 2022-01-16 DIAGNOSIS — I119 Hypertensive heart disease without heart failure: Secondary | ICD-10-CM | POA: Diagnosis not present

## 2022-01-16 DIAGNOSIS — E785 Hyperlipidemia, unspecified: Secondary | ICD-10-CM | POA: Diagnosis not present

## 2022-01-28 DIAGNOSIS — H353211 Exudative age-related macular degeneration, right eye, with active choroidal neovascularization: Secondary | ICD-10-CM | POA: Diagnosis not present

## 2022-02-16 DIAGNOSIS — I119 Hypertensive heart disease without heart failure: Secondary | ICD-10-CM | POA: Diagnosis not present

## 2022-02-16 DIAGNOSIS — E785 Hyperlipidemia, unspecified: Secondary | ICD-10-CM | POA: Diagnosis not present

## 2022-03-19 DIAGNOSIS — I119 Hypertensive heart disease without heart failure: Secondary | ICD-10-CM | POA: Diagnosis not present

## 2022-03-19 DIAGNOSIS — E785 Hyperlipidemia, unspecified: Secondary | ICD-10-CM | POA: Diagnosis not present

## 2022-04-01 DIAGNOSIS — H353211 Exudative age-related macular degeneration, right eye, with active choroidal neovascularization: Secondary | ICD-10-CM | POA: Diagnosis not present

## 2022-04-01 DIAGNOSIS — H35372 Puckering of macula, left eye: Secondary | ICD-10-CM | POA: Diagnosis not present

## 2022-04-01 DIAGNOSIS — H353124 Nonexudative age-related macular degeneration, left eye, advanced atrophic with subfoveal involvement: Secondary | ICD-10-CM | POA: Diagnosis not present

## 2022-05-19 DIAGNOSIS — I119 Hypertensive heart disease without heart failure: Secondary | ICD-10-CM | POA: Diagnosis not present

## 2022-05-19 DIAGNOSIS — E785 Hyperlipidemia, unspecified: Secondary | ICD-10-CM | POA: Diagnosis not present

## 2022-05-24 DIAGNOSIS — R32 Unspecified urinary incontinence: Secondary | ICD-10-CM | POA: Diagnosis not present

## 2022-05-24 DIAGNOSIS — I10 Essential (primary) hypertension: Secondary | ICD-10-CM | POA: Diagnosis not present

## 2022-05-24 DIAGNOSIS — N529 Male erectile dysfunction, unspecified: Secondary | ICD-10-CM | POA: Diagnosis not present

## 2022-05-24 DIAGNOSIS — I2510 Atherosclerotic heart disease of native coronary artery without angina pectoris: Secondary | ICD-10-CM | POA: Diagnosis not present

## 2022-05-24 DIAGNOSIS — Z8249 Family history of ischemic heart disease and other diseases of the circulatory system: Secondary | ICD-10-CM | POA: Diagnosis not present

## 2022-05-24 DIAGNOSIS — I739 Peripheral vascular disease, unspecified: Secondary | ICD-10-CM | POA: Diagnosis not present

## 2022-05-24 DIAGNOSIS — I252 Old myocardial infarction: Secondary | ICD-10-CM | POA: Diagnosis not present

## 2022-05-24 DIAGNOSIS — R69 Illness, unspecified: Secondary | ICD-10-CM | POA: Diagnosis not present

## 2022-05-24 DIAGNOSIS — E785 Hyperlipidemia, unspecified: Secondary | ICD-10-CM | POA: Diagnosis not present

## 2022-05-24 DIAGNOSIS — Z7902 Long term (current) use of antithrombotics/antiplatelets: Secondary | ICD-10-CM | POA: Diagnosis not present

## 2022-06-08 DIAGNOSIS — I119 Hypertensive heart disease without heart failure: Secondary | ICD-10-CM | POA: Diagnosis not present

## 2022-06-08 DIAGNOSIS — E785 Hyperlipidemia, unspecified: Secondary | ICD-10-CM | POA: Diagnosis not present

## 2022-06-08 DIAGNOSIS — R7303 Prediabetes: Secondary | ICD-10-CM | POA: Diagnosis not present

## 2022-06-17 DIAGNOSIS — H353211 Exudative age-related macular degeneration, right eye, with active choroidal neovascularization: Secondary | ICD-10-CM | POA: Diagnosis not present

## 2022-06-18 DIAGNOSIS — Z136 Encounter for screening for cardiovascular disorders: Secondary | ICD-10-CM | POA: Diagnosis not present

## 2022-06-18 DIAGNOSIS — Z6821 Body mass index (BMI) 21.0-21.9, adult: Secondary | ICD-10-CM | POA: Diagnosis not present

## 2022-06-18 DIAGNOSIS — Z1331 Encounter for screening for depression: Secondary | ICD-10-CM | POA: Diagnosis not present

## 2022-06-18 DIAGNOSIS — Z1389 Encounter for screening for other disorder: Secondary | ICD-10-CM | POA: Diagnosis not present

## 2022-06-18 DIAGNOSIS — Z0189 Encounter for other specified special examinations: Secondary | ICD-10-CM | POA: Diagnosis not present

## 2022-06-18 DIAGNOSIS — Z139 Encounter for screening, unspecified: Secondary | ICD-10-CM | POA: Diagnosis not present

## 2022-06-18 DIAGNOSIS — R7303 Prediabetes: Secondary | ICD-10-CM | POA: Diagnosis not present

## 2022-06-18 DIAGNOSIS — Z0000 Encounter for general adult medical examination without abnormal findings: Secondary | ICD-10-CM | POA: Diagnosis not present

## 2022-06-18 DIAGNOSIS — I119 Hypertensive heart disease without heart failure: Secondary | ICD-10-CM | POA: Diagnosis not present

## 2022-06-18 DIAGNOSIS — E785 Hyperlipidemia, unspecified: Secondary | ICD-10-CM | POA: Diagnosis not present

## 2022-06-18 DIAGNOSIS — Z1339 Encounter for screening examination for other mental health and behavioral disorders: Secondary | ICD-10-CM | POA: Diagnosis not present

## 2022-07-19 DIAGNOSIS — I10 Essential (primary) hypertension: Secondary | ICD-10-CM | POA: Diagnosis not present

## 2022-07-19 DIAGNOSIS — J0110 Acute frontal sinusitis, unspecified: Secondary | ICD-10-CM | POA: Diagnosis not present

## 2022-07-19 DIAGNOSIS — E785 Hyperlipidemia, unspecified: Secondary | ICD-10-CM | POA: Diagnosis not present

## 2022-07-22 DIAGNOSIS — Z961 Presence of intraocular lens: Secondary | ICD-10-CM | POA: Diagnosis not present

## 2022-08-19 DIAGNOSIS — I10 Essential (primary) hypertension: Secondary | ICD-10-CM | POA: Diagnosis not present

## 2022-08-19 DIAGNOSIS — E785 Hyperlipidemia, unspecified: Secondary | ICD-10-CM | POA: Diagnosis not present

## 2022-08-26 DIAGNOSIS — H353211 Exudative age-related macular degeneration, right eye, with active choroidal neovascularization: Secondary | ICD-10-CM | POA: Diagnosis not present

## 2022-09-17 DIAGNOSIS — I10 Essential (primary) hypertension: Secondary | ICD-10-CM | POA: Diagnosis not present

## 2022-09-17 DIAGNOSIS — E785 Hyperlipidemia, unspecified: Secondary | ICD-10-CM | POA: Diagnosis not present

## 2022-10-11 DIAGNOSIS — R29810 Facial weakness: Secondary | ICD-10-CM | POA: Diagnosis not present

## 2022-10-11 DIAGNOSIS — R4781 Slurred speech: Secondary | ICD-10-CM | POA: Diagnosis not present

## 2022-10-11 DIAGNOSIS — E538 Deficiency of other specified B group vitamins: Secondary | ICD-10-CM | POA: Diagnosis not present

## 2022-10-11 DIAGNOSIS — I639 Cerebral infarction, unspecified: Secondary | ICD-10-CM | POA: Diagnosis not present

## 2022-10-11 DIAGNOSIS — I6523 Occlusion and stenosis of bilateral carotid arteries: Secondary | ICD-10-CM | POA: Diagnosis not present

## 2022-10-11 DIAGNOSIS — Z743 Need for continuous supervision: Secondary | ICD-10-CM | POA: Diagnosis not present

## 2022-10-11 DIAGNOSIS — I510 Cardiac septal defect, acquired: Secondary | ICD-10-CM | POA: Diagnosis not present

## 2022-10-11 DIAGNOSIS — I252 Old myocardial infarction: Secondary | ICD-10-CM | POA: Diagnosis not present

## 2022-10-11 DIAGNOSIS — I10 Essential (primary) hypertension: Secondary | ICD-10-CM | POA: Diagnosis not present

## 2022-10-11 DIAGNOSIS — G319 Degenerative disease of nervous system, unspecified: Secondary | ICD-10-CM | POA: Diagnosis not present

## 2022-10-12 DIAGNOSIS — F17210 Nicotine dependence, cigarettes, uncomplicated: Secondary | ICD-10-CM | POA: Diagnosis not present

## 2022-10-12 DIAGNOSIS — I6523 Occlusion and stenosis of bilateral carotid arteries: Secondary | ICD-10-CM | POA: Diagnosis not present

## 2022-10-12 DIAGNOSIS — R29818 Other symptoms and signs involving the nervous system: Secondary | ICD-10-CM | POA: Diagnosis not present

## 2022-10-12 DIAGNOSIS — E7800 Pure hypercholesterolemia, unspecified: Secondary | ICD-10-CM | POA: Diagnosis not present

## 2022-10-12 DIAGNOSIS — I6389 Other cerebral infarction: Secondary | ICD-10-CM | POA: Diagnosis not present

## 2022-10-12 DIAGNOSIS — R29704 NIHSS score 4: Secondary | ICD-10-CM | POA: Diagnosis not present

## 2022-10-12 DIAGNOSIS — Z7902 Long term (current) use of antithrombotics/antiplatelets: Secondary | ICD-10-CM | POA: Diagnosis not present

## 2022-10-12 DIAGNOSIS — Z7982 Long term (current) use of aspirin: Secondary | ICD-10-CM | POA: Diagnosis not present

## 2022-10-12 DIAGNOSIS — I10 Essential (primary) hypertension: Secondary | ICD-10-CM | POA: Diagnosis not present

## 2022-10-12 DIAGNOSIS — R200 Anesthesia of skin: Secondary | ICD-10-CM | POA: Diagnosis not present

## 2022-10-12 DIAGNOSIS — F1090 Alcohol use, unspecified, uncomplicated: Secondary | ICD-10-CM | POA: Diagnosis not present

## 2022-10-12 DIAGNOSIS — G319 Degenerative disease of nervous system, unspecified: Secondary | ICD-10-CM | POA: Diagnosis not present

## 2022-10-12 DIAGNOSIS — I252 Old myocardial infarction: Secondary | ICD-10-CM | POA: Diagnosis not present

## 2022-10-12 DIAGNOSIS — I2510 Atherosclerotic heart disease of native coronary artery without angina pectoris: Secondary | ICD-10-CM | POA: Diagnosis not present

## 2022-10-12 DIAGNOSIS — I639 Cerebral infarction, unspecified: Secondary | ICD-10-CM | POA: Diagnosis not present

## 2022-10-12 DIAGNOSIS — R29810 Facial weakness: Secondary | ICD-10-CM | POA: Diagnosis not present

## 2022-10-12 DIAGNOSIS — I510 Cardiac septal defect, acquired: Secondary | ICD-10-CM | POA: Diagnosis not present

## 2022-10-12 DIAGNOSIS — E538 Deficiency of other specified B group vitamins: Secondary | ICD-10-CM | POA: Diagnosis not present

## 2022-10-12 DIAGNOSIS — Z79899 Other long term (current) drug therapy: Secondary | ICD-10-CM | POA: Diagnosis not present

## 2022-10-18 DIAGNOSIS — Z8673 Personal history of transient ischemic attack (TIA), and cerebral infarction without residual deficits: Secondary | ICD-10-CM | POA: Diagnosis not present

## 2022-10-18 DIAGNOSIS — E785 Hyperlipidemia, unspecified: Secondary | ICD-10-CM | POA: Diagnosis not present

## 2022-10-18 DIAGNOSIS — I6523 Occlusion and stenosis of bilateral carotid arteries: Secondary | ICD-10-CM | POA: Diagnosis not present

## 2022-10-18 DIAGNOSIS — Z6821 Body mass index (BMI) 21.0-21.9, adult: Secondary | ICD-10-CM | POA: Diagnosis not present

## 2022-10-18 DIAGNOSIS — I10 Essential (primary) hypertension: Secondary | ICD-10-CM | POA: Diagnosis not present

## 2022-10-19 DIAGNOSIS — Z8673 Personal history of transient ischemic attack (TIA), and cerebral infarction without residual deficits: Secondary | ICD-10-CM | POA: Diagnosis not present

## 2022-10-21 DIAGNOSIS — Z122 Encounter for screening for malignant neoplasm of respiratory organs: Secondary | ICD-10-CM | POA: Diagnosis not present

## 2022-10-21 DIAGNOSIS — R69 Illness, unspecified: Secondary | ICD-10-CM | POA: Diagnosis not present

## 2022-10-21 DIAGNOSIS — J432 Centrilobular emphysema: Secondary | ICD-10-CM | POA: Diagnosis not present

## 2022-10-21 DIAGNOSIS — K449 Diaphragmatic hernia without obstruction or gangrene: Secondary | ICD-10-CM | POA: Diagnosis not present

## 2022-10-21 DIAGNOSIS — I700 Atherosclerosis of aorta: Secondary | ICD-10-CM | POA: Diagnosis not present

## 2022-10-21 DIAGNOSIS — F17210 Nicotine dependence, cigarettes, uncomplicated: Secondary | ICD-10-CM | POA: Diagnosis not present

## 2022-11-04 DIAGNOSIS — H353211 Exudative age-related macular degeneration, right eye, with active choroidal neovascularization: Secondary | ICD-10-CM | POA: Diagnosis not present

## 2022-11-08 DIAGNOSIS — Z8673 Personal history of transient ischemic attack (TIA), and cerebral infarction without residual deficits: Secondary | ICD-10-CM | POA: Diagnosis not present

## 2022-11-15 ENCOUNTER — Ambulatory Visit: Payer: Medicare HMO | Attending: Cardiology | Admitting: Cardiology

## 2022-11-15 ENCOUNTER — Encounter: Payer: Self-pay | Admitting: Cardiology

## 2022-11-15 VITALS — BP 150/80 | HR 63 | Ht 71.0 in | Wt 158.8 lb

## 2022-11-15 DIAGNOSIS — E785 Hyperlipidemia, unspecified: Secondary | ICD-10-CM | POA: Diagnosis not present

## 2022-11-15 DIAGNOSIS — R55 Syncope and collapse: Secondary | ICD-10-CM

## 2022-11-15 DIAGNOSIS — I10 Essential (primary) hypertension: Secondary | ICD-10-CM | POA: Diagnosis not present

## 2022-11-15 DIAGNOSIS — I2510 Atherosclerotic heart disease of native coronary artery without angina pectoris: Secondary | ICD-10-CM | POA: Diagnosis not present

## 2022-11-15 DIAGNOSIS — F17210 Nicotine dependence, cigarettes, uncomplicated: Secondary | ICD-10-CM

## 2022-11-15 NOTE — Patient Instructions (Signed)
Medication Instructions:  Your physician recommends that you continue on your current medications as directed. Please refer to the Current Medication list given to you today.  *If you need a refill on your cardiac medications before your next appointment, please call your pharmacy*   Lab Work: None Ordered If you have labs (blood work) drawn today and your tests are completely normal, you will receive your results only by: MyChart Message (if you have MyChart) OR A paper copy in the mail If you have any lab test that is abnormal or we need to change your treatment, we will call you to review the results.   Testing/Procedures: None Ordered   Follow-Up: At [facility], you and your health needs are our priority.  As part of our continuing mission to provide you with exceptional heart care, we have created designated Provider Care Teams.  These Care Teams include your primary Cardiologist (physician) and Advanced Practice Providers (APPs -  Physician Assistants and Nurse Practitioners) who all work together to provide you with the care you need, when you need it.  We recommend signing up for the patient portal called "MyChart".  Sign up information is provided on this After Visit Summary.  MyChart is used to connect with patients for Virtual Visits (Telemedicine).  Patients are able to view lab/test results, encounter notes, upcoming appointments, etc.  Non-urgent messages can be sent to your provider as well.   To learn more about what you can do with MyChart, go to [email or web address].    Your next appointment:   5 month(s)  The format for your next appointment:   In Person  Provider:   Gypsy Balsam, MD    Other Instructions Referral to Dr. Elberta Fortis

## 2022-11-15 NOTE — Addendum Note (Signed)
Addended by: Baldo Ash D on: 11/15/2022 09:46 AM   Modules accepted: Orders

## 2022-11-15 NOTE — Progress Notes (Signed)
Cardiology Consultation:    Date:  11/15/2022   ID:  Ian Gardner, DOB Feb 08, 1946, MRN [id number]  PCP:  Charlott Rakes, MD  Cardiologist:  Gypsy Balsam, MD   Referring MD: Verlee Rossetti, PA-C   Chief Complaint  Patient presents with   follow up strokes    History of Present Illness:    Ian Gardner is a 77 y.o. male who is being seen today for the evaluation of CVA at the request of Ian Gardner.  Past medical history significant for coronary artery disease many years ago he did suffer from myocardial infarction he did have mildly diminished left ventricle ejection fraction, also is a chronic smoker, essential hypertension dyslipidemia came today to my office after recently being staying in the hospital.  He went there because of slurred speech and left-sided weakness.  He is left dominant.  He was found to have 3 strokes.  He was eval by neurology, also CT angio of his neck has been done which should reveal noncritical stenosis both sides.  He was started on dual antiplatelet therapy with instruction to continue for 3 months and he did wear Zio patch by his primary care physician however I do not have results of this yet.  He was referred to me for possible implantable loop recorder consideration.  He is doing well.  No neurological defects reversed already he is speech is intact.  Strength in the left arm is normal.  He is doing overall very well.  Sadly he still continues to smoke.  He does not exercise on a regular basis, he does not do any diet.  Denies have any cardiac complaints.  There is no chest pain tightness squeezing pressure burning chest.  I did see him last time in 2019.  Past Medical History:  Diagnosis Date   Coronary artery disease involving native coronary artery of native heart without angina pectoris 05/22/2015   Overview:  Old myocardial infarction Overview:  Overview:  Old myocardial infarction   Dyslipidemia 05/22/2015   Erectile  dysfunction due to arterial insufficiency 05/22/2015   Essential hypertension 05/22/2015   Scalp mass 08/30/2017    Past Surgical History:  Procedure Laterality Date   SHOULDER SURGERY      Current Medications: Current Meds  Medication Sig   atorvastatin (LIPITOR) 40 MG tablet Take 1 tablet (40 mg total) by mouth daily. NEED OFFICE VISIT FOR MORE REFILLS   clopidogrel (PLAVIX) 75 MG tablet TAKE 1 TABLET (75 MG TOTAL) BY MOUTH DAILY. NEEDS APPOINTMENT FOR FUTURE REFILL / FINAL ATTEMPT   metoprolol succinate (TOPROL-XL) 25 MG 24 hr tablet Take 25 mg by mouth daily.   metoprolol tartrate (LOPRESSOR) 50 MG tablet Take 25 mg by mouth daily.   Multiple Vitamins-Minerals (ICAPS AREDS FORMULA PO) Take 1 capsule by mouth daily.   nitroGLYCERIN (NITROSTAT) 0.4 MG SL tablet Place 1 tablet (0.4 mg total) under the tongue every 5 (five) minutes as needed. (Patient taking differently: Place 0.4 mg under the tongue every 5 (five) minutes as needed for chest pain.)   valsartan (DIOVAN) 160 MG tablet TAKE 1 TABLET (160 MG TOTAL) BY MOUTH DAILY. NEEDS APPOINTMENT FOR FURTHER REFILLS / 3RD ATTEMPT     Allergies:   Patient has no known allergies.   Social History   Socioeconomic History   Marital status: Married    Spouse name: Not on file   Number of children: Not on file   Years of education: Not on file  Highest education level: Not on file  Occupational History   Not on file  Tobacco Use   Smoking status: Some Days   Smokeless tobacco: Never  Vaping Use   Vaping Use: Never used  Substance and Sexual Activity   Alcohol use: Yes   Drug use: No   Sexual activity: Not on file  Other Topics Concern   Not on file  Social History Narrative   Not on file   Social Determinants of Health   Financial Resource Strain: Not on file  Food Insecurity: Not on file  Transportation Needs: Not on file  Physical Activity: Not on file  Stress: Not on file  Social Connections: Not on file     Family  History: The patient's family history includes Cancer in his maternal grandmother; Heart disease in his father; Stroke in his father. ROS:   Please see the history of present illness.    All 14 point review of systems negative except as described per history of present illness.  EKGs/Labs/Other Studies Reviewed:    The following studies were reviewed today:  Echocardiogram done in the hospital October 12, 2022 showed mild concentric LVH, ejection fraction 57% no significant valvular pathology   CTA of the neck done on 11 October 2022 showed no intracranial large vessel occlusion, there were no evidence of dissection stenosis 50% or more both sides, only mild stenosis approximately 30% noted both sides.  EKG:  EKG is  ordered today.  The ekg ordered today demonstrates normal sinus rhythm, left axis deviation, septal infarct, ST segment abnormalities  Recent Labs: No results found for requested labs within last 365 days.  Recent Lipid Panel No results found for: "CHOL", "TRIG", "HDL", "CHOLHDL", "VLDL", "LDLCALC", "LDLDIRECT"  Physical Exam:    VS:  BP (!) 150/80 (BP Location: Left Arm, Patient Position: Sitting)   Pulse 63   Ht 5\' 11"  (1.803 m)   Wt 158 lb 12.8 oz (72 kg)   SpO2 97%   BMI 22.15 kg/m     Wt Readings from Last 3 Encounters:  11/15/22 158 lb 12.8 oz (72 kg)  08/03/19 162 lb (73.5 kg)  05/01/19 161 lb 6.4 oz (73.2 kg)     GEN:  Well nourished, well developed in no acute distress HEENT: Normal NECK: No JVD; No carotid bruits LYMPHATICS: No lymphadenopathy CARDIAC: RRR, no murmurs, no rubs, no gallops RESPIRATORY:  Clear to auscultation without rales, wheezing or rhonchi  ABDOMEN: Soft, non-tender, non-distended MUSCULOSKELETAL:  No edema; No deformity  SKIN: Warm and dry NEUROLOGIC:  Alert and oriented x 3 PSYCHIATRIC:  Normal affect   ASSESSMENT:    1. Coronary artery disease involving native coronary artery of native heart without angina pectoris   2.  Essential hypertension   3. Cigarette smoker   4. Dyslipidemia    PLAN:    In order of problems listed above:  Coronary disease.  Stable from that point review denies having any recent episodes.  Will continue antiplatelet therapy as well as statin. History of CVA.  I will ask him to be referred to our EP team for consideration of implantable loop recorder.  Apparently his primary care physician already did Zio patch and waiting for results of it if it is unrevealing then implantable loop recorder will be considered. Essential hypertension blood pressure well-controlled continue present management. Smoking we spent at least 5 minutes talking about different technique he can use to try to quit.  Hopefully he will be able to accomplish that  goal.  He will be absolutely essential. Dyslipidemia I did review his K PN which show me data from November 2024 with LDL of 42 HDL 48.  Will continue present management which include high intensity statin   Medication Adjustments/Labs and Tests Ordered: Current medicines are reviewed at length with the patient today.  Concerns regarding medicines are outlined above.  No orders of the defined types were placed in this encounter.  No orders of the defined types were placed in this encounter.   Signed, Georgeanna Lea, MD, [facility]. 11/15/2022 9:04 AM    [facility]

## 2022-11-17 DIAGNOSIS — I10 Essential (primary) hypertension: Secondary | ICD-10-CM | POA: Diagnosis not present

## 2022-11-17 DIAGNOSIS — E785 Hyperlipidemia, unspecified: Secondary | ICD-10-CM | POA: Diagnosis not present

## 2022-11-24 DIAGNOSIS — Z8673 Personal history of transient ischemic attack (TIA), and cerebral infarction without residual deficits: Secondary | ICD-10-CM | POA: Diagnosis not present

## 2022-11-24 DIAGNOSIS — R918 Other nonspecific abnormal finding of lung field: Secondary | ICD-10-CM | POA: Diagnosis not present

## 2022-11-24 DIAGNOSIS — I700 Atherosclerosis of aorta: Secondary | ICD-10-CM | POA: Diagnosis not present

## 2022-11-24 DIAGNOSIS — J439 Emphysema, unspecified: Secondary | ICD-10-CM | POA: Diagnosis not present

## 2022-11-24 DIAGNOSIS — Z6821 Body mass index (BMI) 21.0-21.9, adult: Secondary | ICD-10-CM | POA: Diagnosis not present

## 2022-12-08 DIAGNOSIS — R7303 Prediabetes: Secondary | ICD-10-CM | POA: Diagnosis not present

## 2022-12-08 DIAGNOSIS — E785 Hyperlipidemia, unspecified: Secondary | ICD-10-CM | POA: Diagnosis not present

## 2022-12-08 DIAGNOSIS — Z125 Encounter for screening for malignant neoplasm of prostate: Secondary | ICD-10-CM | POA: Diagnosis not present

## 2022-12-16 DIAGNOSIS — J439 Emphysema, unspecified: Secondary | ICD-10-CM | POA: Diagnosis not present

## 2022-12-17 DIAGNOSIS — E785 Hyperlipidemia, unspecified: Secondary | ICD-10-CM | POA: Diagnosis not present

## 2022-12-17 DIAGNOSIS — Z6822 Body mass index (BMI) 22.0-22.9, adult: Secondary | ICD-10-CM | POA: Diagnosis not present

## 2022-12-17 DIAGNOSIS — J439 Emphysema, unspecified: Secondary | ICD-10-CM | POA: Diagnosis not present

## 2022-12-17 DIAGNOSIS — I6523 Occlusion and stenosis of bilateral carotid arteries: Secondary | ICD-10-CM | POA: Diagnosis not present

## 2022-12-17 DIAGNOSIS — R7303 Prediabetes: Secondary | ICD-10-CM | POA: Diagnosis not present

## 2022-12-18 DIAGNOSIS — J439 Emphysema, unspecified: Secondary | ICD-10-CM | POA: Diagnosis not present

## 2022-12-18 DIAGNOSIS — I700 Atherosclerosis of aorta: Secondary | ICD-10-CM | POA: Diagnosis not present

## 2023-01-03 ENCOUNTER — Encounter: Payer: Self-pay | Admitting: Cardiology

## 2023-01-03 ENCOUNTER — Ambulatory Visit: Payer: Medicare HMO | Attending: Cardiology | Admitting: Cardiology

## 2023-01-03 VITALS — BP 146/68 | HR 68 | Ht 71.0 in | Wt 160.0 lb

## 2023-01-03 DIAGNOSIS — I2510 Atherosclerotic heart disease of native coronary artery without angina pectoris: Secondary | ICD-10-CM | POA: Diagnosis not present

## 2023-01-03 DIAGNOSIS — E785 Hyperlipidemia, unspecified: Secondary | ICD-10-CM

## 2023-01-03 DIAGNOSIS — I10 Essential (primary) hypertension: Secondary | ICD-10-CM | POA: Diagnosis not present

## 2023-01-03 DIAGNOSIS — I639 Cerebral infarction, unspecified: Secondary | ICD-10-CM

## 2023-01-03 NOTE — Progress Notes (Signed)
  Electrophysiology Office Note:   Date:  01/03/2023  ID:  Roque Lias, DOB 11/12/1945, MRN [id number]  Primary Cardiologist: None Electrophysiologist: None      History of Present Illness:   DARELL GRAHL is a 77 y.o. male with h/o coronary artery disease, hyperlipidemia, hypertension, tobacco abuse, MI cryptogenic stroke seen today for  for Electrophysiology evaluation of cryptogenic stroke at the request of Gypsy Balsam.    He presented the hospital with slurred speech and left-sided weakness.  He was found to have 3 strokes at the time.  He was evaluated by neurology.  He had a CT angiography of the neck which showed noncritical stenosis of both carotids.  He was started on dual antiplatelet therapy and wore a Zio patch.  No atrial fibrillation was noted on Zio patch.  He had an echo that showed a normal ejection fraction while in the hospital.  Since his discharge he has done well.  He has had no further strokelike symptoms.  He has regained full function of his left hand and no longer has slurred speech.  Review of systems complete and found to be negative unless listed in HPI.   Studies Reviewed:    EKG is not ordered today. EKG from 11/10/22 reviewed which showed sinus rhythm, septal infarct, rate 63    Risk Assessment/Calculations:             Physical Exam:   VS:  BP (!) 146/68   Pulse 68   Ht 5\' 11"  (1.803 m)   Wt 160 lb (72.6 kg)   SpO2 98%   BMI 22.32 kg/m    Wt Readings from Last 3 Encounters:  01/03/23 160 lb (72.6 kg)  11/15/22 158 lb 12.8 oz (72 kg)  08/03/19 162 lb (73.5 kg)     GEN: Well nourished, well developed in no acute distress NECK: No JVD; No carotid bruits CARDIAC: Regular rate and rhythm, no murmurs, rubs, gallops RESPIRATORY:  Clear to auscultation without rales, wheezing or rhonchi  ABDOMEN: Soft, non-tender, non-distended EXTREMITIES:  No edema; No deformity   ASSESSMENT AND PLAN:    1.  Cryptogenic stroke: Patient has fully  recovered.  He is able to use his left arm without issue.  He no longer has slurred speech.  He wore a Zio monitor that showed no atrial fibrillation.  Due to that, we Jagar Lua plan for ILR implant.  Risk and benefits were discussed.  Is include bleeding and infection.  He understands these risks and is agreed to the procedure.  2.  Coronary artery disease: Continue dual antiplatelet therapy per primary cardiology.  3.  Hypertension: Mildly elevated today.  Usually well-controlled.  No changes.  4.  Tobacco abuse: Complete cessation encouraged  5.  Hyperlipidemia: Continue statin per primary cardiology  Follow up with Dr. Elberta Fortis in 4 weeks  Signed, Abb Gobert Jorja Loa, MD

## 2023-01-03 NOTE — Patient Instructions (Signed)
Medication Instructions:  Your physician recommends that you continue on your current medications as directed. Please refer to the Current Medication list given to you today.  *If you need a refill on your cardiac medications before your next appointment, please call your pharmacy*   Lab Work: None ordered   Testing/Procedures: None ordered   Follow-Up: At [facility], you and your health needs are our priority.  As part of our continuing mission to provide you with exceptional heart care, we have created designated Provider Care Teams.  These Care Teams include your primary Cardiologist (physician) and Advanced Practice Providers (APPs -  Physician Assistants and Nurse Practitioners) who all work together to provide you with the care you need, when you need it.  Your next appointment:   In [facility] for loop recorder implant  The format for your next appointment:   In Person  Provider:   Loman Brooklyn, MD{   Thank you for choosing [facility]!!   Dory Horn, RN [phone]  Other Instructions  Implantable Loop Recorder Placement  An implantable loop recorder is a small electronic device that is placed under the skin of the chest. The device records the electrical activity of the heart over a long period of time. Your health care provider can download these recordings to monitor your heart. You may need an implantable loop recorder if you have periods of abnormal heart activity (arrhythmias) or unexplained fainting (syncope). The recorder can be left in place for as long as 3 years. Tell a health care provider about: Any allergies you have. All medicines you are taking, including vitamins, herbs, eye drops, creams, and over-the-counter medicines. Any problems you or family members have had with anesthesia. Any bleeding problems you have. Any surgeries you have had. Any medical conditions you have. Whether you are pregnant or may be pregnant. What are the  risks? Your health care provider will talk with you about risks. These may include: Infection. Bleeding. Allergic reactions to anesthesia. Damage to nerves or blood vessels. Failure of the device to work. This could require another surgery to replace it. What happens before the procedure? You may have a physical exam, blood tests, and imaging tests, such as a chest X-ray. Follow instructions from your health care provider about what you may eat and drink Ask your health care provider about: Changing or stopping your regular medicines. These include any diabetes medicines or blood thinners you take. Taking medicines such as aspirin and ibuprofen. These medicines can thin your blood. Do not take them unless your health care provider tells you to. Taking over-the-counter medicines, vitamins, herbs, and supplements. Ask your health care provider: How your surgery site will be marked. What steps will be taken to help prevent infection. These steps may include: Removing hair at the surgery site. Washing skin with a soap that kills germs. Taking antibiotics. If you will be going home right after the procedure, plan to have a responsible adult: Take you home from the hospital or clinic. You will not be allowed to drive. Do not use any products that contain nicotine or tobacco before the procedure. These products include cigarettes, chewing tobacco, and vaping devices, such as e-cigarettes. If you need help quitting, ask your health care provider What happens during the procedure? An IV will be inserted into one of your veins. You may be given: A sedative. This helps you relax Anesthesia. This will: Numb certain areas of your body. Make you fall asleep for surgery. A small incision will  be made on the left side of your upper chest. A pocket will be created under your skin. The device will be placed in the pocket. The incision will be closed with stitches (sutures) or adhesive strips. A  bandage (dressing) will be placed over the incision. The procedure may vary among health care providers and hospitals. What happens after the procedure? Your blood pressure, heart rate, breathing rate, and blood oxygen level will be monitored until you leave the hospital or clinic. You may be able to go home on the day of your surgery. Before you go home: Your health care provider will program your recorder. You will learn how to trigger your device with a handheld activator. You will learn how to send recordings to your health care provider. You will get an ID card for your device, and you will be told when to use it. This information is not intended to replace advice given to you by your health care provider. Make sure you discuss any questions you have with your health care provider. Document Revised: 11/30/2021 Document Reviewed: 11/30/2021 Elsevier Patient Education  2024 [employer].

## 2023-01-13 DIAGNOSIS — H353211 Exudative age-related macular degeneration, right eye, with active choroidal neovascularization: Secondary | ICD-10-CM | POA: Diagnosis not present

## 2023-01-17 DIAGNOSIS — J439 Emphysema, unspecified: Secondary | ICD-10-CM | POA: Diagnosis not present

## 2023-01-17 DIAGNOSIS — Z6822 Body mass index (BMI) 22.0-22.9, adult: Secondary | ICD-10-CM | POA: Diagnosis not present

## 2023-01-17 DIAGNOSIS — N529 Male erectile dysfunction, unspecified: Secondary | ICD-10-CM | POA: Diagnosis not present

## 2023-01-17 DIAGNOSIS — I700 Atherosclerosis of aorta: Secondary | ICD-10-CM | POA: Diagnosis not present

## 2023-02-09 ENCOUNTER — Other Ambulatory Visit: Payer: Self-pay

## 2023-02-09 ENCOUNTER — Telehealth: Payer: Self-pay | Admitting: Cardiology

## 2023-02-09 NOTE — Telephone Encounter (Signed)
Made aware pt scheduled for ILR implant 8/22. Pt thought he would get another call from the office about this. She appreciates the follow up.

## 2023-02-09 NOTE — Telephone Encounter (Signed)
Left message to call back  

## 2023-02-09 NOTE — Telephone Encounter (Signed)
Left message for [place] to call back:

## 2023-02-09 NOTE — Telephone Encounter (Signed)
[place] from [facility] called about patient's CONSULT appt with Dr. Elberta Fortis about getting the loop recorder put in. [phone] ext [phone]

## 2023-02-09 NOTE — Telephone Encounter (Signed)
Returning nurses call. Please advise

## 2023-02-17 DIAGNOSIS — I700 Atherosclerosis of aorta: Secondary | ICD-10-CM | POA: Diagnosis not present

## 2023-02-17 DIAGNOSIS — J439 Emphysema, unspecified: Secondary | ICD-10-CM | POA: Diagnosis not present

## 2023-02-22 DIAGNOSIS — Z6822 Body mass index (BMI) 22.0-22.9, adult: Secondary | ICD-10-CM | POA: Diagnosis not present

## 2023-02-22 DIAGNOSIS — M542 Cervicalgia: Secondary | ICD-10-CM | POA: Diagnosis not present

## 2023-03-10 ENCOUNTER — Ambulatory Visit: Payer: Medicare HMO | Admitting: Cardiology

## 2023-03-20 DIAGNOSIS — J439 Emphysema, unspecified: Secondary | ICD-10-CM | POA: Diagnosis not present

## 2023-03-20 DIAGNOSIS — I700 Atherosclerosis of aorta: Secondary | ICD-10-CM | POA: Diagnosis not present

## 2023-03-24 DIAGNOSIS — H353211 Exudative age-related macular degeneration, right eye, with active choroidal neovascularization: Secondary | ICD-10-CM | POA: Diagnosis not present

## 2023-04-08 ENCOUNTER — Ambulatory Visit: Payer: Medicare HMO | Admitting: Cardiology

## 2023-04-19 DIAGNOSIS — I700 Atherosclerosis of aorta: Secondary | ICD-10-CM | POA: Diagnosis not present

## 2023-04-19 DIAGNOSIS — J439 Emphysema, unspecified: Secondary | ICD-10-CM | POA: Diagnosis not present

## 2023-06-02 DIAGNOSIS — H353211 Exudative age-related macular degeneration, right eye, with active choroidal neovascularization: Secondary | ICD-10-CM | POA: Diagnosis not present

## 2023-06-19 DIAGNOSIS — J439 Emphysema, unspecified: Secondary | ICD-10-CM | POA: Diagnosis not present

## 2023-06-19 DIAGNOSIS — I700 Atherosclerosis of aorta: Secondary | ICD-10-CM | POA: Diagnosis not present

## 2023-07-20 DIAGNOSIS — J439 Emphysema, unspecified: Secondary | ICD-10-CM | POA: Diagnosis not present

## 2023-07-20 DIAGNOSIS — I700 Atherosclerosis of aorta: Secondary | ICD-10-CM | POA: Diagnosis not present

## 2023-07-25 DIAGNOSIS — I119 Hypertensive heart disease without heart failure: Secondary | ICD-10-CM | POA: Diagnosis not present

## 2023-07-25 DIAGNOSIS — R7303 Prediabetes: Secondary | ICD-10-CM | POA: Diagnosis not present

## 2023-07-25 DIAGNOSIS — E785 Hyperlipidemia, unspecified: Secondary | ICD-10-CM | POA: Diagnosis not present

## 2023-08-01 DIAGNOSIS — J439 Emphysema, unspecified: Secondary | ICD-10-CM | POA: Diagnosis not present

## 2023-08-01 DIAGNOSIS — R7303 Prediabetes: Secondary | ICD-10-CM | POA: Diagnosis not present

## 2023-08-01 DIAGNOSIS — I2510 Atherosclerotic heart disease of native coronary artery without angina pectoris: Secondary | ICD-10-CM | POA: Diagnosis not present

## 2023-08-01 DIAGNOSIS — E875 Hyperkalemia: Secondary | ICD-10-CM | POA: Diagnosis not present

## 2023-08-01 DIAGNOSIS — Z6822 Body mass index (BMI) 22.0-22.9, adult: Secondary | ICD-10-CM | POA: Diagnosis not present

## 2023-08-01 DIAGNOSIS — E785 Hyperlipidemia, unspecified: Secondary | ICD-10-CM | POA: Diagnosis not present

## 2023-08-11 DIAGNOSIS — H353211 Exudative age-related macular degeneration, right eye, with active choroidal neovascularization: Secondary | ICD-10-CM | POA: Diagnosis not present

## 2023-08-20 DIAGNOSIS — I700 Atherosclerosis of aorta: Secondary | ICD-10-CM | POA: Diagnosis not present

## 2023-08-20 DIAGNOSIS — J439 Emphysema, unspecified: Secondary | ICD-10-CM | POA: Diagnosis not present

## 2023-09-17 DIAGNOSIS — I700 Atherosclerosis of aorta: Secondary | ICD-10-CM | POA: Diagnosis not present

## 2023-09-17 DIAGNOSIS — J439 Emphysema, unspecified: Secondary | ICD-10-CM | POA: Diagnosis not present

## 2023-09-22 DIAGNOSIS — H353211 Exudative age-related macular degeneration, right eye, with active choroidal neovascularization: Secondary | ICD-10-CM | POA: Diagnosis not present

## 2023-10-18 DIAGNOSIS — J439 Emphysema, unspecified: Secondary | ICD-10-CM | POA: Diagnosis not present

## 2023-10-18 DIAGNOSIS — I2510 Atherosclerotic heart disease of native coronary artery without angina pectoris: Secondary | ICD-10-CM | POA: Diagnosis not present

## 2023-11-17 DIAGNOSIS — J439 Emphysema, unspecified: Secondary | ICD-10-CM | POA: Diagnosis not present

## 2023-11-17 DIAGNOSIS — I2510 Atherosclerotic heart disease of native coronary artery without angina pectoris: Secondary | ICD-10-CM | POA: Diagnosis not present

## 2023-11-17 DIAGNOSIS — E785 Hyperlipidemia, unspecified: Secondary | ICD-10-CM | POA: Diagnosis not present

## 2023-12-01 DIAGNOSIS — H353211 Exudative age-related macular degeneration, right eye, with active choroidal neovascularization: Secondary | ICD-10-CM | POA: Diagnosis not present

## 2023-12-14 ENCOUNTER — Other Ambulatory Visit: Payer: Self-pay

## 2023-12-15 ENCOUNTER — Encounter: Payer: Self-pay | Admitting: Cardiology

## 2023-12-15 ENCOUNTER — Ambulatory Visit: Attending: Cardiology | Admitting: Cardiology

## 2023-12-15 VITALS — BP 130/70 | HR 63 | Ht 71.0 in | Wt 160.0 lb

## 2023-12-15 DIAGNOSIS — I2510 Atherosclerotic heart disease of native coronary artery without angina pectoris: Secondary | ICD-10-CM | POA: Diagnosis not present

## 2023-12-15 DIAGNOSIS — F17210 Nicotine dependence, cigarettes, uncomplicated: Secondary | ICD-10-CM

## 2023-12-15 DIAGNOSIS — I6930 Unspecified sequelae of cerebral infarction: Secondary | ICD-10-CM | POA: Insufficient documentation

## 2023-12-15 DIAGNOSIS — E785 Hyperlipidemia, unspecified: Secondary | ICD-10-CM | POA: Diagnosis not present

## 2023-12-15 DIAGNOSIS — I10 Essential (primary) hypertension: Secondary | ICD-10-CM | POA: Diagnosis not present

## 2023-12-15 NOTE — Patient Instructions (Signed)
 Medication Instructions:  Your physician recommends that you continue on your current medications as directed. Please refer to the Current Medication list given to you today.  *If you need a refill on your cardiac medications before your next appointment, please call your pharmacy*  Lab Work: None If you have labs (blood work) drawn today and your tests are completely normal, you will receive your results only by: MyChart Message (if you have MyChart) OR A paper copy in the mail If you have any lab test that is abnormal or we need to change your treatment, we will call you to review the results.  Testing/Procedures: Your physician has requested that you have an echocardiogram. Echocardiography is a painless test that uses sound waves to create images of your heart. It provides your doctor with information about the size and shape of your heart and how well your heart's chambers and valves are working. This procedure takes approximately one hour. There are no restrictions for this procedure. Please do NOT wear cologne, perfume, aftershave, or lotions (deodorant is allowed). Please arrive 15 minutes prior to your appointment time.  Please note: We ask at that you not bring children with you during ultrasound (echo/ vascular) testing. Due to room size and safety concerns, children are not allowed in the ultrasound rooms during exams. Our front office staff cannot provide observation of children in our lobby area while testing is being conducted. An adult accompanying a patient to their appointment will only be allowed in the ultrasound room at the discretion of the ultrasound technician under special circumstances. We apologize for any inconvenience.  Your physician has requested that you have a carotid duplex. This test is an ultrasound of the carotid arteries in your neck. It looks at blood flow through these arteries that supply the brain with blood. Allow one hour for this exam. There are no  restrictions or special instructions.   Follow-Up: At [facility], you and your health needs are our priority.  As part of our continuing mission to provide you with exceptional heart care, our providers are all part of one team.  This team includes your primary Cardiologist (physician) and Advanced Practice Providers or APPs (Physician Assistants and Nurse Practitioners) who all work together to provide you with the care you need, when you need it.  Your next appointment:   6 month(s)  Provider:   Ralene Burger, MD    We recommend signing up for the patient portal called "MyChart".  Sign up information is provided on this After Visit Summary.  MyChart is used to connect with patients for Virtual Visits (Telemedicine).  Patients are able to view lab/test results, encounter notes, upcoming appointments, etc.  Non-urgent messages can be sent to your provider as well.   To learn more about what you can do with MyChart, go to [email or web address].   Other Instructions None

## 2023-12-15 NOTE — Addendum Note (Signed)
 Addended by: Aurelio Leer I on: 12/15/2023 09:23 AM   Modules accepted: Orders

## 2023-12-15 NOTE — Progress Notes (Signed)
 Cardiology Office Note:    Date:  12/15/2023   ID:  Ian Gardner, DOB 1946/06/04, MRN [id number]  PCP:  Barbar Levine, MD  Cardiologist:  Ralene Burger, MD    Referring MD: Barbar Levine, MD   Chief Complaint  Patient presents with   Follow-up    History of Present Illness:    Ian Gardner is a 78 y.o. male past medical history significant for myocardial infarction many years ago, mildly diminished left ventricle ejection fraction, he is a chronic smoker does have history of essential hypertension, dyslipidemia, history of CVA.  Last time I seen him was in the spring of last year after he was admitted to the hospital because of CVA likely complete recovery without any residual neurological deficit.  He did wear a monitor to look for evidence of atrial fibrillation that was negative after that he was referred to our EP team for implantable loop recorder which has been scheduled however he decided no personal to pursue it.  Comes today to my office feeling fine.  He described to have some shortness of breath with exertion which he thinks is related to his smoking which I think is right.  No TIA, CVA-like symptoms.  No chest pain tightness squeezing pressure burning chest chest shortness of breath with exertion  Past Medical History:  Diagnosis Date   Cigarette smoker 08/03/2019   Coronary artery disease involving native coronary artery of native heart without angina pectoris 05/22/2015   Overview:  Old myocardial infarction Overview:  Overview:  Old myocardial infarction   Dyslipidemia 05/22/2015   Erectile dysfunction due to arterial insufficiency 05/22/2015   Essential hypertension 05/22/2015   Scalp mass 08/30/2017    Past Surgical History:  Procedure Laterality Date   SHOULDER SURGERY      Current Medications: Current Meds  Medication Sig   atorvastatin  (LIPITOR) 40 MG tablet Take 1 tablet (40 mg total) by mouth daily. NEED OFFICE VISIT FOR MORE REFILLS    clopidogrel  (PLAVIX ) 75 MG tablet TAKE 1 TABLET (75 MG TOTAL) BY MOUTH DAILY. NEEDS APPOINTMENT FOR FUTURE REFILL / FINAL ATTEMPT   metoprolol succinate (TOPROL-XL) 50 MG 24 hr tablet Take 50 mg by mouth daily. Take with or immediately following a meal.   Multiple Vitamins-Minerals (ICAPS AREDS FORMULA PO) Take 1 capsule by mouth daily.   nitroGLYCERIN  (NITROSTAT ) 0.4 MG SL tablet Place 0.4 mg under the tongue every 5 (five) minutes as needed for chest pain.   valsartan -hydrochlorothiazide (DIOVAN -HCT) 320-12.5 MG tablet Take 1 tablet by mouth daily.     Allergies:   Patient has no known allergies.   Social History   Socioeconomic History   Marital status: Married    Spouse name: Not on file   Number of children: Not on file   Years of education: Not on file   Highest education level: Not on file  Occupational History   Not on file  Tobacco Use   Smoking status: Some Days   Smokeless tobacco: Never  Vaping Use   Vaping status: Never Used  Substance and Sexual Activity   Alcohol  use: Yes   Drug use: No   Sexual activity: Not on file  Other Topics Concern   Not on file  Social History Narrative   Not on file   Social Drivers of Health   Financial Resource Strain: Not on file  Food Insecurity: Not on file  Transportation Needs: Not on file  Physical Activity: Not on file  Stress: Not on file  Social Connections: Not on file     Family History: The patient's family history includes Cancer in his maternal grandmother; Heart disease in his father; Stroke in his father. ROS:   Please see the history of present illness.    All 14 point review of systems negative except as described per history of present illness  EKGs/Labs/Other Studies Reviewed:         Recent Labs: No results found for requested labs within last 365 days.  Recent Lipid Panel No results found for: "CHOL", "TRIG", "HDL", "CHOLHDL", "VLDL", "LDLCALC", "LDLDIRECT"  Physical Exam:    VS:  BP 130/70    Pulse 63   Ht 5\' 11"  (1.803 m)   Wt 160 lb (72.6 kg)   SpO2 94%   BMI 22.32 kg/m     Wt Readings from Last 3 Encounters:  12/15/23 160 lb (72.6 kg)  01/03/23 160 lb (72.6 kg)  11/15/22 158 lb 12.8 oz (72 kg)     GEN:  Well nourished, well developed in no acute distress HEENT: Normal NECK: No JVD; No carotid bruits LYMPHATICS: No lymphadenopathy CARDIAC: RRR, no murmurs, no rubs, no gallops RESPIRATORY:  Clear to auscultation without rales, wheezing or rhonchi  ABDOMEN: Soft, non-tender, non-distended MUSCULOSKELETAL:  No edema; No deformity  SKIN: Warm and dry LOWER EXTREMITIES: no swelling NEUROLOGIC:  Alert and oriented x 3 PSYCHIATRIC:  Normal affect   ASSESSMENT:    1. Essential hypertension   2. Coronary artery disease involving native coronary artery of native heart without angina pectoris   3. Dyslipidemia   4. Cigarette smoker   5. Late effect of cerebrovascular accident (CVA)    PLAN:    In order of problems listed above:  Coronary artery disease.  History of diminished left ventricle ejection fraction will repeat echocardiogram recheck left ventricular ejection fraction. Peripheral vascular disease in form of carotic artery stenosis up to 30%.  Will repeat carotid ultrasound. Smoking still a future problem he said that he smokes only half a pack per week I told him simply stop he said that he will try to do that. Dyslipidemia I did review K PN which show LDL of 47 and 49 we will continue present management which includes Lipitor is also on antiplatelet therapy   Medication Adjustments/Labs and Tests Ordered: Current medicines are reviewed at length with the patient today.  Concerns regarding medicines are outlined above.  Orders Placed This Encounter  Procedures   EKG 12-Lead   Medication changes: No orders of the defined types were placed in this encounter.   Signed, Manfred Seed, MD, [facility] 12/15/2023 9:02 AM    [facility]

## 2023-12-16 ENCOUNTER — Ambulatory Visit (HOSPITAL_BASED_OUTPATIENT_CLINIC_OR_DEPARTMENT_OTHER)
Admission: RE | Admit: 2023-12-16 | Discharge: 2023-12-16 | Disposition: A | Discharge status: Home / Self Care | Source: Ambulatory Visit | Attending: Cardiology | Admitting: Cardiology

## 2023-12-16 DIAGNOSIS — I6523 Occlusion and stenosis of bilateral carotid arteries: Secondary | ICD-10-CM | POA: Diagnosis not present

## 2023-12-16 DIAGNOSIS — I2510 Atherosclerotic heart disease of native coronary artery without angina pectoris: Secondary | ICD-10-CM

## 2023-12-16 DIAGNOSIS — E785 Hyperlipidemia, unspecified: Secondary | ICD-10-CM

## 2023-12-16 DIAGNOSIS — F17210 Nicotine dependence, cigarettes, uncomplicated: Secondary | ICD-10-CM

## 2023-12-16 DIAGNOSIS — I6930 Unspecified sequelae of cerebral infarction: Secondary | ICD-10-CM

## 2023-12-16 DIAGNOSIS — I10 Essential (primary) hypertension: Secondary | ICD-10-CM

## 2023-12-18 DIAGNOSIS — E785 Hyperlipidemia, unspecified: Secondary | ICD-10-CM | POA: Diagnosis not present

## 2023-12-18 DIAGNOSIS — J439 Emphysema, unspecified: Secondary | ICD-10-CM | POA: Diagnosis not present

## 2023-12-29 DIAGNOSIS — R7303 Prediabetes: Secondary | ICD-10-CM | POA: Diagnosis not present

## 2023-12-29 DIAGNOSIS — I119 Hypertensive heart disease without heart failure: Secondary | ICD-10-CM | POA: Diagnosis not present

## 2023-12-29 DIAGNOSIS — E785 Hyperlipidemia, unspecified: Secondary | ICD-10-CM | POA: Diagnosis not present

## 2024-01-02 ENCOUNTER — Ambulatory Visit: Payer: Self-pay | Admitting: Cardiology

## 2024-01-12 ENCOUNTER — Ambulatory Visit

## 2024-01-17 DIAGNOSIS — E785 Hyperlipidemia, unspecified: Secondary | ICD-10-CM | POA: Diagnosis not present

## 2024-01-17 DIAGNOSIS — R7303 Prediabetes: Secondary | ICD-10-CM | POA: Diagnosis not present

## 2024-01-17 DIAGNOSIS — J439 Emphysema, unspecified: Secondary | ICD-10-CM | POA: Diagnosis not present

## 2024-02-03 ENCOUNTER — Ambulatory Visit: Attending: Cardiology

## 2024-02-03 DIAGNOSIS — I2510 Atherosclerotic heart disease of native coronary artery without angina pectoris: Secondary | ICD-10-CM

## 2024-02-03 DIAGNOSIS — I088 Other rheumatic multiple valve diseases: Secondary | ICD-10-CM | POA: Diagnosis not present

## 2024-02-03 DIAGNOSIS — I5030 Unspecified diastolic (congestive) heart failure: Secondary | ICD-10-CM

## 2024-02-03 DIAGNOSIS — F17210 Nicotine dependence, cigarettes, uncomplicated: Secondary | ICD-10-CM

## 2024-02-03 DIAGNOSIS — I10 Essential (primary) hypertension: Secondary | ICD-10-CM | POA: Diagnosis not present

## 2024-02-03 DIAGNOSIS — E785 Hyperlipidemia, unspecified: Secondary | ICD-10-CM

## 2024-02-03 DIAGNOSIS — I6930 Unspecified sequelae of cerebral infarction: Secondary | ICD-10-CM

## 2024-02-03 LAB — ECHOCARDIOGRAM COMPLETE
Area-P 1/2: 2.32 cm2
S' Lateral: 2.8 cm

## 2024-02-07 ENCOUNTER — Telehealth: Payer: Self-pay

## 2024-02-07 NOTE — Telephone Encounter (Signed)
 Echo Results reviewed with pt as per Dr. Vanetta Shawl note.  Pt verbalized understanding and had no additional questions. Routed to PCP

## 2024-02-09 DIAGNOSIS — H353211 Exudative age-related macular degeneration, right eye, with active choroidal neovascularization: Secondary | ICD-10-CM | POA: Diagnosis not present

## 2024-02-17 DIAGNOSIS — J439 Emphysema, unspecified: Secondary | ICD-10-CM | POA: Diagnosis not present

## 2024-02-17 DIAGNOSIS — E785 Hyperlipidemia, unspecified: Secondary | ICD-10-CM | POA: Diagnosis not present

## 2024-02-27 DIAGNOSIS — D485 Neoplasm of uncertain behavior of skin: Secondary | ICD-10-CM | POA: Diagnosis not present

## 2024-02-27 DIAGNOSIS — C44622 Squamous cell carcinoma of skin of right upper limb, including shoulder: Secondary | ICD-10-CM | POA: Diagnosis not present

## 2024-02-27 DIAGNOSIS — R2231 Localized swelling, mass and lump, right upper limb: Secondary | ICD-10-CM | POA: Diagnosis not present

## 2024-04-03 DIAGNOSIS — D0461 Carcinoma in situ of skin of right upper limb, including shoulder: Secondary | ICD-10-CM | POA: Diagnosis not present

## 2024-04-03 DIAGNOSIS — C44622 Squamous cell carcinoma of skin of right upper limb, including shoulder: Secondary | ICD-10-CM | POA: Diagnosis not present

## 2024-04-17 DIAGNOSIS — C44622 Squamous cell carcinoma of skin of right upper limb, including shoulder: Secondary | ICD-10-CM | POA: Diagnosis not present

## 2024-04-30 DIAGNOSIS — I119 Hypertensive heart disease without heart failure: Secondary | ICD-10-CM | POA: Diagnosis not present

## 2024-04-30 DIAGNOSIS — E785 Hyperlipidemia, unspecified: Secondary | ICD-10-CM | POA: Diagnosis not present

## 2024-04-30 DIAGNOSIS — R7303 Prediabetes: Secondary | ICD-10-CM | POA: Diagnosis not present

## 2024-04-30 DIAGNOSIS — Z125 Encounter for screening for malignant neoplasm of prostate: Secondary | ICD-10-CM | POA: Diagnosis not present

## 2024-05-07 DIAGNOSIS — Z6821 Body mass index (BMI) 21.0-21.9, adult: Secondary | ICD-10-CM | POA: Diagnosis not present

## 2024-05-07 DIAGNOSIS — R7303 Prediabetes: Secondary | ICD-10-CM | POA: Diagnosis not present

## 2024-05-07 DIAGNOSIS — R972 Elevated prostate specific antigen [PSA]: Secondary | ICD-10-CM | POA: Diagnosis not present

## 2024-05-07 DIAGNOSIS — E785 Hyperlipidemia, unspecified: Secondary | ICD-10-CM | POA: Diagnosis not present

## 2024-05-07 DIAGNOSIS — I2510 Atherosclerotic heart disease of native coronary artery without angina pectoris: Secondary | ICD-10-CM | POA: Diagnosis not present

## 2024-05-19 DIAGNOSIS — I2510 Atherosclerotic heart disease of native coronary artery without angina pectoris: Secondary | ICD-10-CM | POA: Diagnosis not present

## 2024-05-19 DIAGNOSIS — J439 Emphysema, unspecified: Secondary | ICD-10-CM | POA: Diagnosis not present

## 2024-06-18 DIAGNOSIS — I2510 Atherosclerotic heart disease of native coronary artery without angina pectoris: Secondary | ICD-10-CM | POA: Diagnosis not present

## 2024-06-18 DIAGNOSIS — J439 Emphysema, unspecified: Secondary | ICD-10-CM | POA: Diagnosis not present
